# Patient Record
Sex: Female | Born: 1960 | Race: White | Hispanic: No | Marital: Single | Smoking: Former smoker
Health system: Southern US, Community
[De-identification: ages and names within clinical notes are randomized; demographics above are authoritative.]

## PROBLEM LIST (undated history)

## (undated) DIAGNOSIS — N2889 Other specified disorders of kidney and ureter: Secondary | ICD-10-CM

## (undated) DIAGNOSIS — T8859XA Other complications of anesthesia, initial encounter: Secondary | ICD-10-CM

## (undated) DIAGNOSIS — R112 Nausea with vomiting, unspecified: Secondary | ICD-10-CM

## (undated) DIAGNOSIS — N189 Chronic kidney disease, unspecified: Secondary | ICD-10-CM

## (undated) DIAGNOSIS — C801 Malignant (primary) neoplasm, unspecified: Secondary | ICD-10-CM

## (undated) DIAGNOSIS — E119 Type 2 diabetes mellitus without complications: Secondary | ICD-10-CM

## (undated) DIAGNOSIS — J189 Pneumonia, unspecified organism: Secondary | ICD-10-CM

## (undated) DIAGNOSIS — Z87442 Personal history of urinary calculi: Secondary | ICD-10-CM

## (undated) DIAGNOSIS — K219 Gastro-esophageal reflux disease without esophagitis: Secondary | ICD-10-CM

## (undated) DIAGNOSIS — Z9889 Other specified postprocedural states: Secondary | ICD-10-CM

## (undated) DIAGNOSIS — Z87820 Personal history of traumatic brain injury: Secondary | ICD-10-CM

## (undated) DIAGNOSIS — T4145XA Adverse effect of unspecified anesthetic, initial encounter: Secondary | ICD-10-CM

## (undated) DIAGNOSIS — I10 Essential (primary) hypertension: Secondary | ICD-10-CM

## (undated) DIAGNOSIS — J40 Bronchitis, not specified as acute or chronic: Secondary | ICD-10-CM

## (undated) DIAGNOSIS — Z8489 Family history of other specified conditions: Secondary | ICD-10-CM

## (undated) DIAGNOSIS — L409 Psoriasis, unspecified: Secondary | ICD-10-CM

---

## 2006-12-18 ENCOUNTER — Ambulatory Visit: Payer: Self-pay | Admitting: Oncology

## 2007-02-09 ENCOUNTER — Encounter: Payer: Self-pay | Admitting: Hematology and Oncology

## 2007-02-09 ENCOUNTER — Ambulatory Visit: Payer: Self-pay | Admitting: Hematology and Oncology

## 2007-02-09 ENCOUNTER — Other Ambulatory Visit: Admission: RE | Admit: 2007-02-09 | Discharge: 2007-02-09 | Payer: Self-pay | Admitting: Hematology and Oncology

## 2007-02-09 LAB — CBC WITH DIFFERENTIAL/PLATELET
BASO%: 0.7 % (ref 0.0–2.0)
HCT: 42.6 % (ref 34.8–46.6)
LYMPH%: 26.1 % (ref 14.0–48.0)
MCH: 32.4 pg (ref 26.0–34.0)
MCHC: 35.9 g/dL (ref 32.0–36.0)
MCV: 90.2 fL (ref 81.0–101.0)
MONO#: 0.6 10*3/uL (ref 0.1–0.9)
MONO%: 4.6 % (ref 0.0–13.0)
NEUT%: 66.2 % (ref 39.6–76.8)
Platelets: 331 10*3/uL (ref 145–400)

## 2007-02-09 LAB — ERYTHROCYTE SEDIMENTATION RATE: Sed Rate: 10 mm/hr (ref 0–30)

## 2007-02-12 LAB — COMPREHENSIVE METABOLIC PANEL
ALT: 10 U/L (ref 0–35)
AST: 15 U/L (ref 0–37)
Albumin: 4.1 g/dL (ref 3.5–5.2)
BUN: 9 mg/dL (ref 6–23)
Calcium: 9 mg/dL (ref 8.4–10.5)
Chloride: 103 mEq/L (ref 96–112)
Potassium: 4.4 mEq/L (ref 3.5–5.3)

## 2007-02-15 ENCOUNTER — Ambulatory Visit (HOSPITAL_COMMUNITY): Admission: RE | Admit: 2007-02-15 | Discharge: 2007-02-15 | Payer: Self-pay | Admitting: Hematology and Oncology

## 2008-03-17 IMAGING — CT CT CHEST W/ CM
2 of 4 series · 15 of 36 positions shown, 18 images · IV contrast (omnipaque)
Comparison: None

CLINICAL DATA: Smoker and cough

CHEST CT WITH CONTRAST
TECHNIQUE: Multidetector CT imaging of the chest was performed following the
standard protocol during bolus administration of intravenous contrast.
Contrast:  50 cc Omnipaque 300

[Series 2: chest routine 5.0 b40f · axial · 0.74mm/px · z∈[-357,-67]mm · 12 of 68 slices shown, 15 images]
[im 5/68  mediastinal]
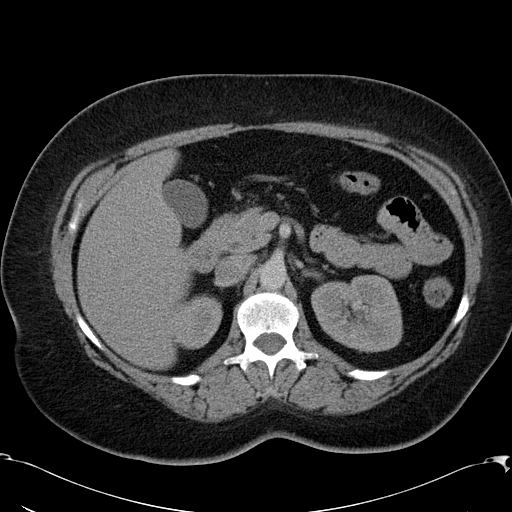
[im 5/68  lung]
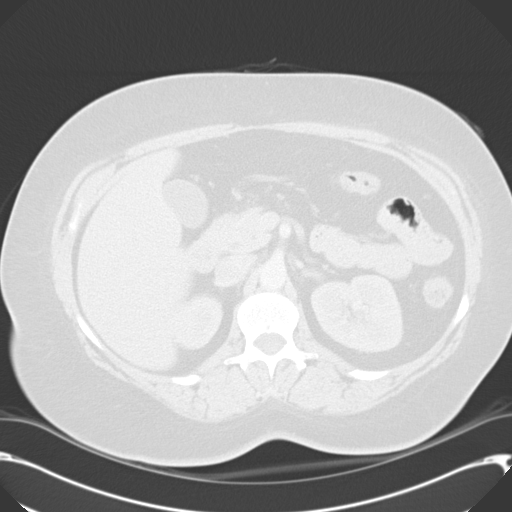
[im 10/68  lung]
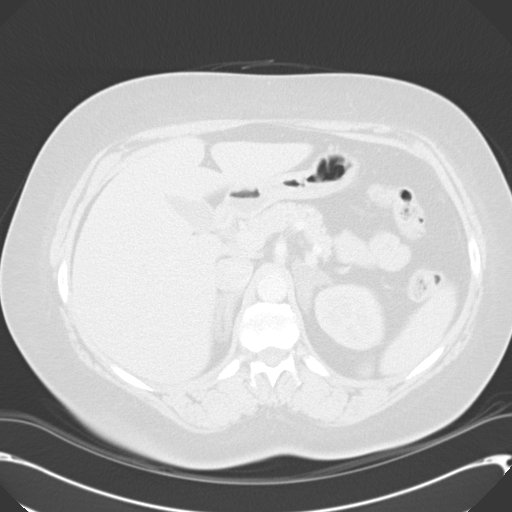
[im 15/68  lung]
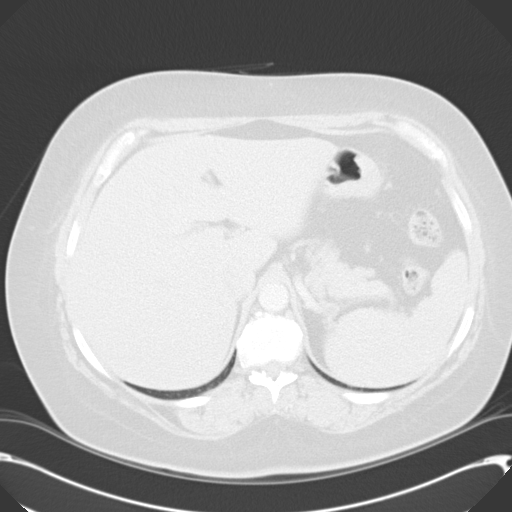
[im 20/68  lung]
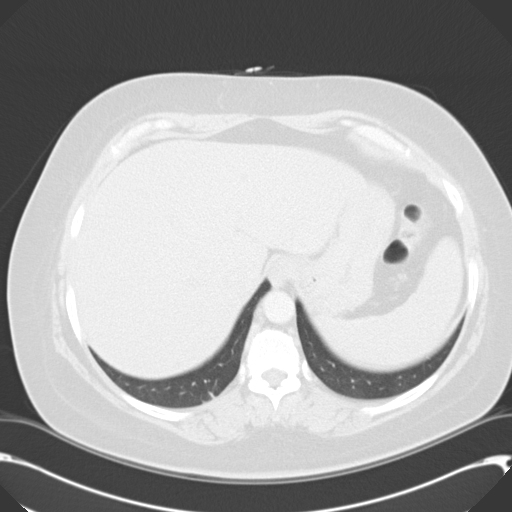
[im 24/68  mediastinal]
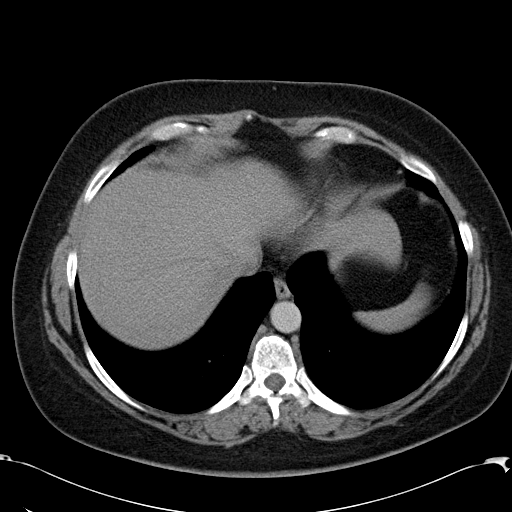
[im 24/68  lung]
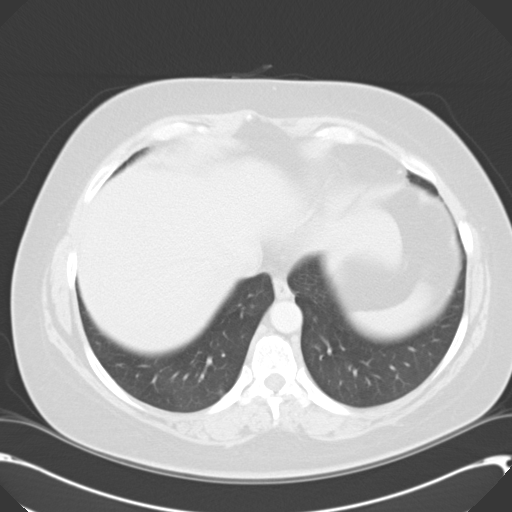
[im 29/68  lung]
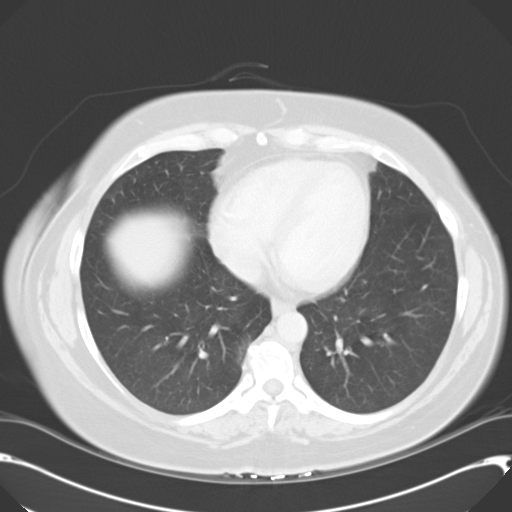
[im 39/68  lung]
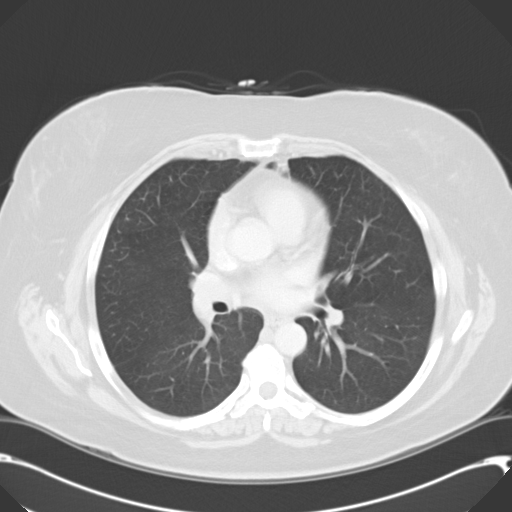
[im 44/68  lung]
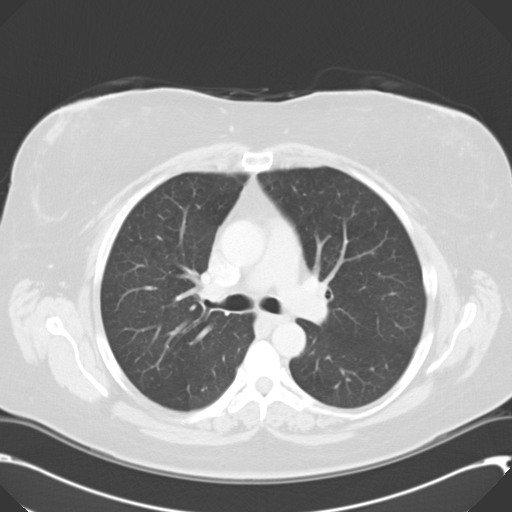
[im 48/68  mediastinal]
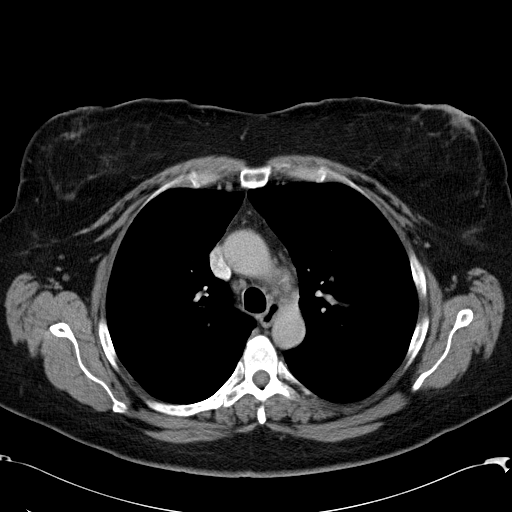
[im 48/68  lung]
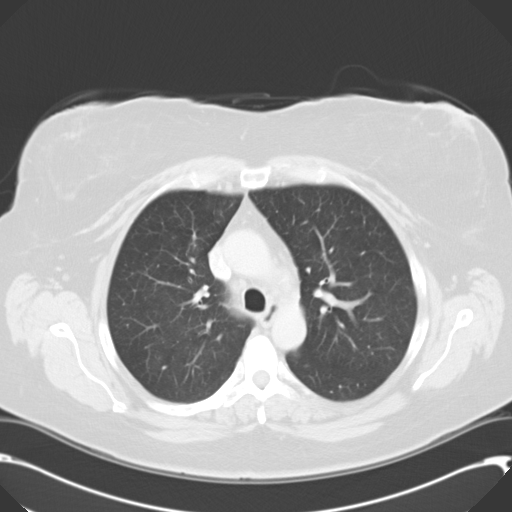
[im 53/68  lung]
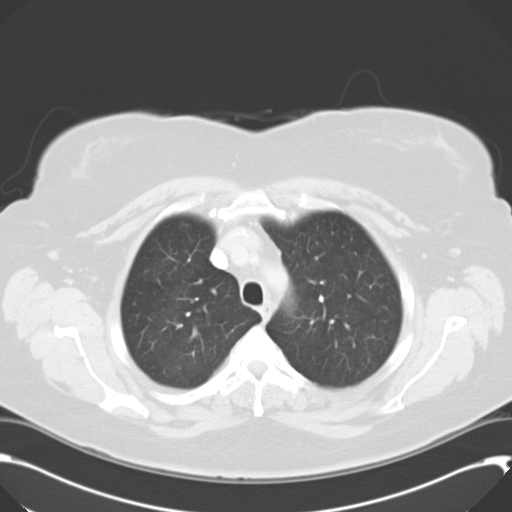
[im 58/68  lung]
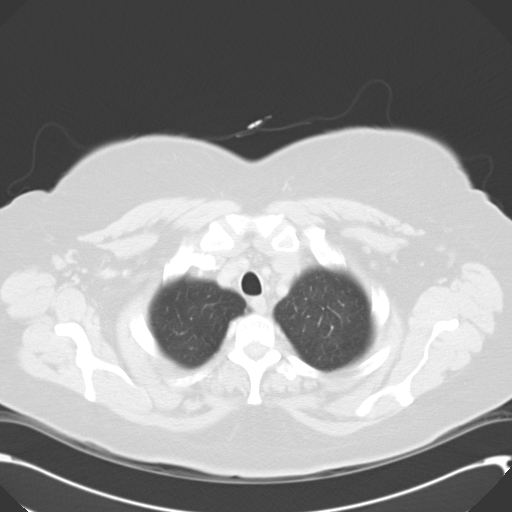
[im 63/68  lung]
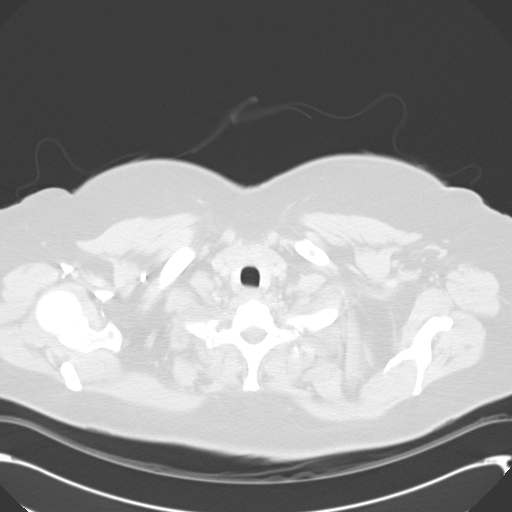

[Series 602: <mpr thick range> · coronal · 0.74mm/px · 3 of 84 slices shown]
[im 17/84  lung]
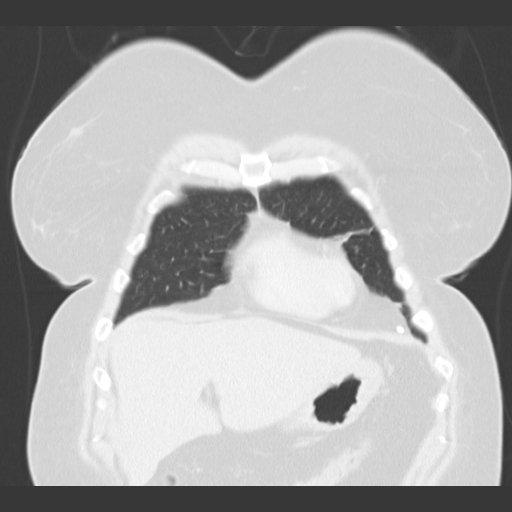
[im 34/84  lung]
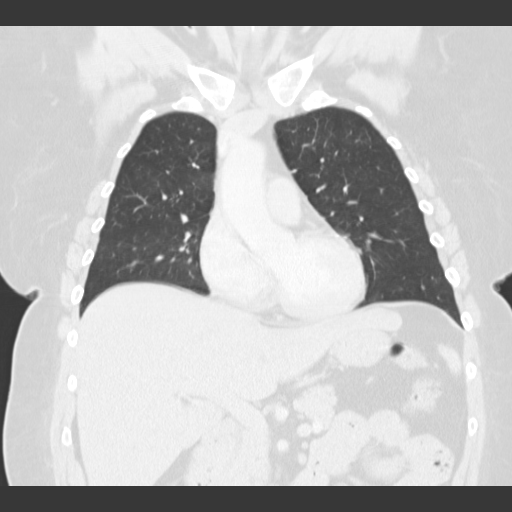
[im 50/84  lung]
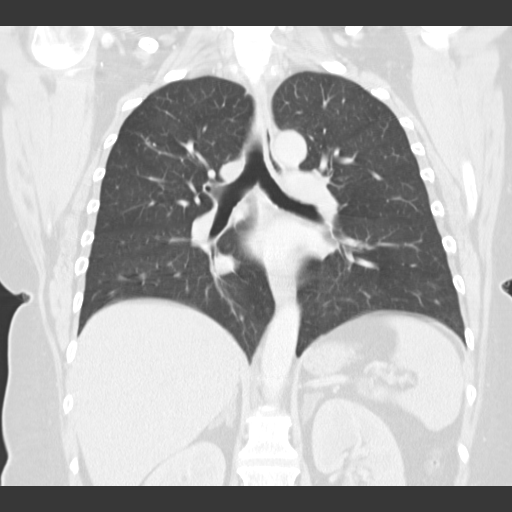

[15 of 36 positions shown; findings below may reference images not displayed]

FINDINGS: No enlarged axillary lymph nodes.

No enlarged supraclavicular lymph nodes.

There is no enlarged mediastinal, or hilar lymph nodes.

No pericardial or pleural effusion noted.

Left lower lobe pulmonary nodule measures 2.3 mm, image 36.

Clustered tiny nodules within the right upper lobe are noted, the largest of
which measures 3 mm, image 18.

There is scarring identified within the lingular segment of the left lung.

Review of the visualized osseous structures shows thoracic spondylosis.

No lytic or sclerotic lesions are identified.

There is enlargement of both adrenal glands without focal mass.

IMPRESSION

1. No specific evidence for malignancy or acute infectious process.

2. Clustered micronodules in the right upper lobe and a solitary micronodular in
the left lower lobe are noted. These have a nonspecific appearance. A followup
examination in 6 months is recommended.

## 2014-06-27 HISTORY — PX: KIDNEY SURGERY: SHX687

## 2015-10-29 ENCOUNTER — Encounter (HOSPITAL_BASED_OUTPATIENT_CLINIC_OR_DEPARTMENT_OTHER): Payer: Self-pay | Admitting: *Deleted

## 2015-10-29 ENCOUNTER — Other Ambulatory Visit: Payer: Self-pay | Admitting: Urology

## 2015-10-29 NOTE — Progress Notes (Signed)
NPO AFTER MN.  ARRIVE AT 0800.  NEEDS ISTAT AND EKG. 

## 2015-10-30 ENCOUNTER — Ambulatory Visit (HOSPITAL_BASED_OUTPATIENT_CLINIC_OR_DEPARTMENT_OTHER): Payer: Commercial Managed Care - PPO | Admitting: Certified Registered"

## 2015-10-30 ENCOUNTER — Encounter (HOSPITAL_BASED_OUTPATIENT_CLINIC_OR_DEPARTMENT_OTHER): Payer: Self-pay | Admitting: Certified Registered"

## 2015-10-30 ENCOUNTER — Ambulatory Visit (HOSPITAL_BASED_OUTPATIENT_CLINIC_OR_DEPARTMENT_OTHER)
Admission: RE | Admit: 2015-10-30 | Discharge: 2015-10-30 | Disposition: A | Discharge status: Home / Self Care | Payer: Commercial Managed Care - PPO | Source: Ambulatory Visit | Attending: Urology | Admitting: Urology

## 2015-10-30 ENCOUNTER — Encounter (HOSPITAL_BASED_OUTPATIENT_CLINIC_OR_DEPARTMENT_OTHER)
Admission: RE | Disposition: A | Discharge status: Home / Self Care | Payer: Self-pay | Source: Ambulatory Visit | Attending: Urology

## 2015-10-30 DIAGNOSIS — N1330 Unspecified hydronephrosis: Secondary | ICD-10-CM | POA: Diagnosis not present

## 2015-10-30 DIAGNOSIS — C662 Malignant neoplasm of left ureter: Secondary | ICD-10-CM | POA: Insufficient documentation

## 2015-10-30 DIAGNOSIS — Z79899 Other long term (current) drug therapy: Secondary | ICD-10-CM | POA: Insufficient documentation

## 2015-10-30 DIAGNOSIS — Z7984 Long term (current) use of oral hypoglycemic drugs: Secondary | ICD-10-CM | POA: Diagnosis not present

## 2015-10-30 DIAGNOSIS — N289 Disorder of kidney and ureter, unspecified: Secondary | ICD-10-CM | POA: Diagnosis present

## 2015-10-30 DIAGNOSIS — E119 Type 2 diabetes mellitus without complications: Secondary | ICD-10-CM | POA: Diagnosis not present

## 2015-10-30 DIAGNOSIS — F17210 Nicotine dependence, cigarettes, uncomplicated: Secondary | ICD-10-CM | POA: Diagnosis not present

## 2015-10-30 DIAGNOSIS — N2889 Other specified disorders of kidney and ureter: Secondary | ICD-10-CM

## 2015-10-30 HISTORY — PX: CYSTOSCOPY W/ URETERAL STENT PLACEMENT: SHX1429

## 2015-10-30 HISTORY — DX: Personal history of traumatic brain injury: Z87.820

## 2015-10-30 HISTORY — DX: Psoriasis, unspecified: L40.9

## 2015-10-30 HISTORY — DX: Gastro-esophageal reflux disease without esophagitis: K21.9

## 2015-10-30 HISTORY — DX: Type 2 diabetes mellitus without complications: E11.9

## 2015-10-30 HISTORY — DX: Other specified disorders of kidney and ureter: N28.89

## 2015-10-30 LAB — POCT HEMOGLOBIN-HEMACUE: Hemoglobin: 13 g/dL (ref 12.0–15.0)

## 2015-10-30 SURGERY — CYSTOSCOPY, WITH RETROGRADE PYELOGRAM AND URETERAL STENT INSERTION
Anesthesia: General | Laterality: Left

## 2015-10-30 MED ORDER — FENTANYL CITRATE (PF) 100 MCG/2ML IJ SOLN
INTRAMUSCULAR | Status: AC
  Filled 2015-10-30: qty 2

## 2015-10-30 MED ORDER — HYDROCODONE-ACETAMINOPHEN 10-325 MG PO TABS: 1.0000 | ORAL_TABLET | ORAL | Status: DC | PRN

## 2015-10-30 MED ORDER — LIDOCAINE HCL (CARDIAC) 20 MG/ML IV SOLN
INTRAVENOUS | Status: DC | PRN
  Administered 2015-10-30: 60 mg via INTRAVENOUS

## 2015-10-30 MED ORDER — MIDAZOLAM HCL 5 MG/5ML IJ SOLN
INTRAMUSCULAR | Status: DC | PRN
  Administered 2015-10-30: 2 mg via INTRAVENOUS

## 2015-10-30 MED ORDER — CIPROFLOXACIN IN D5W 400 MG/200ML IV SOLN
400.0000 mg | INTRAVENOUS | Status: AC
  Administered 2015-10-30 (×2): 400 mg via INTRAVENOUS
  Filled 2015-10-30: qty 200

## 2015-10-30 MED ORDER — HYDROMORPHONE HCL 1 MG/ML IJ SOLN
0.2500 mg | INTRAMUSCULAR | Status: DC | PRN
  Filled 2015-10-30: qty 1

## 2015-10-30 MED ORDER — TAMSULOSIN HCL 0.4 MG PO CAPS
ORAL_CAPSULE | ORAL | Status: AC
  Filled 2015-10-30: qty 1

## 2015-10-30 MED ORDER — FENTANYL CITRATE (PF) 100 MCG/2ML IJ SOLN
INTRAMUSCULAR | Status: DC | PRN
  Administered 2015-10-30: 50 ug via INTRAVENOUS
  Administered 2015-10-30 (×3): 25 ug via INTRAVENOUS

## 2015-10-30 MED ORDER — PHENAZOPYRIDINE HCL 200 MG PO TABS: 200.0000 mg | ORAL_TABLET | Freq: Three times a day (TID) | ORAL | Status: DC | PRN

## 2015-10-30 MED ORDER — PROPOFOL 10 MG/ML IV BOLUS
INTRAVENOUS | Status: DC | PRN
  Administered 2015-10-30: 200 mg via INTRAVENOUS

## 2015-10-30 MED ORDER — ONDANSETRON HCL 4 MG/2ML IJ SOLN
INTRAMUSCULAR | Status: AC
  Filled 2015-10-30: qty 2

## 2015-10-30 MED ORDER — MEPERIDINE HCL 25 MG/ML IJ SOLN
6.2500 mg | INTRAMUSCULAR | Status: DC | PRN
  Filled 2015-10-30: qty 1

## 2015-10-30 MED ORDER — PROMETHAZINE HCL 25 MG/ML IJ SOLN
6.2500 mg | INTRAMUSCULAR | Status: DC | PRN
  Filled 2015-10-30: qty 1

## 2015-10-30 MED ORDER — LIDOCAINE HCL (CARDIAC) 20 MG/ML IV SOLN
INTRAVENOUS | Status: AC
  Filled 2015-10-30: qty 5

## 2015-10-30 MED ORDER — OXYBUTYNIN CHLORIDE 5 MG PO TABS
ORAL_TABLET | ORAL | Status: AC
  Filled 2015-10-30: qty 1

## 2015-10-30 MED ORDER — PROPOFOL 10 MG/ML IV BOLUS
INTRAVENOUS | Status: AC
  Filled 2015-10-30: qty 20

## 2015-10-30 MED ORDER — LACTATED RINGERS IV SOLN
INTRAVENOUS | Status: DC
  Filled 2015-10-30: qty 1000

## 2015-10-30 MED ORDER — DEXAMETHASONE SODIUM PHOSPHATE 10 MG/ML IJ SOLN
INTRAMUSCULAR | Status: AC
  Filled 2015-10-30: qty 1

## 2015-10-30 MED ORDER — CIPROFLOXACIN IN D5W 400 MG/200ML IV SOLN
INTRAVENOUS | Status: AC
  Filled 2015-10-30: qty 200

## 2015-10-30 MED ORDER — PHENAZOPYRIDINE HCL 200 MG PO TABS
200.0000 mg | ORAL_TABLET | Freq: Once | ORAL | Status: AC
  Administered 2015-10-30: 200 mg via ORAL
  Filled 2015-10-30: qty 1

## 2015-10-30 MED ORDER — LACTATED RINGERS IV SOLN
INTRAVENOUS | Status: DC
  Administered 2015-10-30: 09:00:00 via INTRAVENOUS
  Filled 2015-10-30: qty 1000

## 2015-10-30 MED ORDER — TAMSULOSIN HCL 0.4 MG PO CAPS
0.4000 mg | ORAL_CAPSULE | Freq: Once | ORAL | Status: AC
  Administered 2015-10-30: 0.4 mg via ORAL
  Filled 2015-10-30: qty 1

## 2015-10-30 MED ORDER — MIDAZOLAM HCL 2 MG/2ML IJ SOLN
INTRAMUSCULAR | Status: AC
  Filled 2015-10-30: qty 2

## 2015-10-30 MED ORDER — OXYBUTYNIN CHLORIDE 5 MG PO TABS
5.0000 mg | ORAL_TABLET | Freq: Once | ORAL | Status: AC
  Administered 2015-10-30: 5 mg via ORAL
  Filled 2015-10-30: qty 1

## 2015-10-30 MED ORDER — WHITE PETROLATUM GEL
Status: AC
  Filled 2015-10-30: qty 5

## 2015-10-30 MED ORDER — ONDANSETRON HCL 4 MG/2ML IJ SOLN
INTRAMUSCULAR | Status: DC | PRN
  Administered 2015-10-30: 4 mg via INTRAVENOUS

## 2015-10-30 MED ORDER — PHENAZOPYRIDINE HCL 100 MG PO TABS
ORAL_TABLET | ORAL | Status: AC
  Filled 2015-10-30: qty 2

## 2015-10-30 MED ORDER — DEXAMETHASONE SODIUM PHOSPHATE 4 MG/ML IJ SOLN
INTRAMUSCULAR | Status: DC | PRN
  Administered 2015-10-30: 10 mg via INTRAVENOUS

## 2015-10-30 SURGICAL SUPPLY — 31 items
ADAPTER CATH URET PLST 4-6FR (CATHETERS) IMPLANT
BAG DRAIN URO-CYSTO SKYTR STRL (DRAIN) ×2 IMPLANT
BASKET STNLS GEMINI 4WIRE 3FR (BASKET) ×2 IMPLANT
BASKET ZERO TIP NITINOL 2.4FR (BASKET) ×2 IMPLANT
CANISTER SUCT LVC 12 LTR MEDI- (MISCELLANEOUS) IMPLANT
CATH INTERMIT  6FR 70CM (CATHETERS) ×4 IMPLANT
CATH URET 5FR 28IN CONE TIP (BALLOONS)
CATH URET 5FR 70CM CONE TIP (BALLOONS) IMPLANT
CATH URET DUAL LUMEN 6-10FR 50 (CATHETERS) ×2 IMPLANT
CLOTH BEACON ORANGE TIMEOUT ST (SAFETY) ×2 IMPLANT
DRSG TELFA 3X8 NADH (GAUZE/BANDAGES/DRESSINGS) ×2 IMPLANT
FORCEPS BIOP 2.4F 115CM BACKLD (INSTRUMENTS) ×2 IMPLANT
GLOVE BIO SURGEON STRL SZ8 (GLOVE) ×2 IMPLANT
GOWN STRL REUS W/ TWL LRG LVL3 (GOWN DISPOSABLE) ×1 IMPLANT
GOWN STRL REUS W/ TWL XL LVL3 (GOWN DISPOSABLE) ×1 IMPLANT
GOWN STRL REUS W/TWL LRG LVL3 (GOWN DISPOSABLE) ×1
GOWN STRL REUS W/TWL XL LVL3 (GOWN DISPOSABLE) ×1
GUIDEWIRE 0.038 PTFE COATED (WIRE) ×2 IMPLANT
GUIDEWIRE ANG ZIPWIRE 038X150 (WIRE) IMPLANT
GUIDEWIRE STR DUAL SENSOR (WIRE) ×6 IMPLANT
IV NS IRRIG 3000ML ARTHROMATIC (IV SOLUTION) ×2 IMPLANT
KIT BALLN UROMAX 15FX4 (MISCELLANEOUS) ×1 IMPLANT
KIT BALLN UROMAX 26 75X4 (MISCELLANEOUS) ×1
KIT ROOM TURNOVER WOR (KITS) ×2 IMPLANT
MANIFOLD NEPTUNE II (INSTRUMENTS) IMPLANT
NS IRRIG 500ML POUR BTL (IV SOLUTION) ×2 IMPLANT
PACK CYSTO (CUSTOM PROCEDURE TRAY) ×2 IMPLANT
STENT URET 6FRX24 CONTOUR (STENTS) ×2 IMPLANT
TUBE CONNECTING 12X1/4 (SUCTIONS) IMPLANT
UROMAX ULTRA BALLOON ×2 IMPLANT
WATER STERILE IRR 3000ML UROMA (IV SOLUTION) IMPLANT

## 2015-10-30 NOTE — H&P (Signed)
History of Present Illness Joan Ramirez is a 55 year old female with a left proximal ureteral mass.    Gross hematuria: She noticed gross hematuria that was not associated with any flank pain or change in her urinary pattern. She has a 37-pack-year smoking history. Her urine culture was found to be negative. She told me that she actually had experienced hematuria on 2 occasions about once a year over the past 2 years.  Urine cytology: Negative  Creatinine - 0.71     Interval history: She has not noticed any further hematuria. She denies flank pain.   Past Medical History Problems  1. History of asthma (Z87.09) 2. History of diabetes mellitus (Z86.39)  Surgical History Problems  1. History of No Surgical Problems  Current Meds 1. Fish Oil CAPS;  Therapy: (Recorded:18Apr2017) to Recorded 2. GlyBURIDE-MetFORMIN 2.5-500 MG Oral Tablet;  Therapy: (Recorded:04May2017) to Recorded  Allergies Medication  1. No Known Drug Allergies  Family History Problems  1. Family history of Death of family member : Mother, Father   Mother - 62/ lung cancerFather - 76/ lung cancer 2. Family history of diabetes mellitus (Z83.3) : Father, Brother 3. Family history of lung cancer (Z80.1) : Mother, Father 4. Family history of lymphoma (Z80.2) : Mother 5. Family history of skin cancer (Z80.8) : Mother  Social History Problems  1. Denied: History of Alcohol use 2. Caffeine use (F15.90)   5-6 per day 3. Current every day smoker (F17.200)   smokes 2 packs per day since 1977 4. Occupation   Dance movement psychotherapist 5. Single   Review of Systems Genitourinary, constitutional, skin, eye, otolaryngeal, hematologic/lymphatic, cardiovascular, pulmonary, endocrine, musculoskeletal, gastrointestinal, neurological and psychiatric system(s) were reviewed and pertinent findings if present are noted and are otherwise negative.  Genitourinary: nocturia, incontinence, urinary hesitancy and hematuria.   Gastrointestinal: vomiting, heartburn, diarrhea and constipation.  Constitutional: feeling tired (fatigue) and recent weight loss.  Integumentary: skin rash/lesion and pruritus.  Respiratory: cough.  Endocrine: polydipsia.  Neurological: dizziness.    Physical Exam Constitutional: Well nourished and well developed . No acute distress. The patient appears well hydrated.  ENT:. The ears and nose are normal in appearance.  Neck: The appearance of the neck is normal.  Pulmonary: No respiratory distress.  Cardiovascular: Heart rate and rhythm are normal.  Abdomen: The abdomen is mildly obese. The abdomen is soft and nontender. No suprapubic tenderness. No CVA tenderness.  Genitourinary:Examination of the external genitalia shows normal female external genitalia. The urethra is normal in appearance. Vaginal exam demonstrates atrophy and the vaginal epithelium to be poorly estrogenized. No cystocele is identified. No rectocele is identified. The bladder is normal on palpation and non tender. The anus is normal on inspection. The perineum is normal on inspection.  Skin: Normal skin turgor and normal skin color and pigmentation.  Neuro/Psych:. Mood and affect are appropriate.     Vitals Vital Signs  Height: 5 ft 7 in Weight: 204 lb  BMI Calculated: 31.95 BSA Calculated: 2.04 Blood Pressure: 156 / 85 Heart Rate: 91  Physical Exam Genitourinary:  Chaperone Present: Joan Ramirez.  Examination of the external genitalia shows normal female external genitalia and no lesions. The urethra is normal in appearance and not tender. There is no urethral mass. Vaginal exam demonstrates atrophy. The adnexa are palpably normal. The bladder is non tender and not distended. The anus is normal on inspection. The perineum is normal on inspection.    Results/Data Urine  COLOR YELLOW  APPEARANCE CLEAR  SPECIFIC GRAVITY 1.020  pH  5.5  GLUCOSE NEGATIVE  BILIRUBIN NEGATIVE  KETONE NEGATIVE  BLOOD 3+   PROTEIN NEGATIVE  NITRITE NEGATIVE  LEUKOCYTE ESTERASE NEGATIVE  SQUAMOUS EPITHELIAL/HPF 0-5 HPF WBC 0-5 WBC/HPF RBC 3-10 RBC/HPF BACTERIA NONE SEEN HPF CRYSTALS See Below HPF CASTS NONE SEEN LPF Yeast NONE SEEN HPF     ** RADIOLOGY REPORT BY [facility] **   CLINICAL DATA: Micro hematuria and gross hematuria.  EXAM: CT ABDOMEN AND PELVIS WITHOUT AND WITH CONTRAST  TECHNIQUE: Multidetector CT imaging of the abdomen and pelvis was performed following the standard protocol before and following the bolus administration of intravenous contrast.  CONTRAST: 125 cc Isovue  COMPARISON: CT thorax 02/15/2008  FINDINGS: Lower chest: Lung bases are clear.  Hepatobiliary: No focal hepatic lesion. No biliary duct dilatation. Gallbladder is normal. Common bile duct is normal.  Pancreas: Pancreas is normal. No ductal dilatation. No pancreatic inflammation.  Spleen: Normal spleen  Adrenals/urinary tract: Bilateral low-density enlargement of the adrenal glands consistent with bilateral adrenal adenomas.  Non IV contrast images demonstrate a 1 mm calculus in the lower pole of the LEFT kidney and a 1 mm calculus lower pole of the RIGHT kidney. Note ureterolithiasis or bladder calculi.  There is an enhancing mass within the LEFT renal pelvis which extends into the proximal LEFT ureter. This mass expands the LEFT renal pelvis to 3 cm (image 42, series 4). Mass extends to to the ureteropelvic junction (image 64 of the coronal series). There is hydronephrosis of the LEFT kidney associated with the renal pelvis mass.  Delayed imaging demonstrates filling of the distal LEFT ureter consistent partial obstruction of the LEFT kidney.  No filling defect within the bladder. The RIGHT kidney and ureter are normal.  Stomach/Bowel: Stomach, small bowel, appendix, and cecum are normal. The colon and rectosigmoid colon are normal.  Vascular/Lymphatic: Abdominal aorta is  normal caliber. There is no retroperitoneal or periportal lymphadenopathy. No pelvic lymphadenopathy.  Reproductive: Post hysterectomy.  Other: No free fluid.  Musculoskeletal: No aggressive osseous lesion.  IMPRESSION: 1. 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 2. Moderate to severe hydronephrosis of the LEFT kidney secondary to a partially obstructing LEFT renal pelvis mass. 3. Small bilateral renal calculi. 4. No RIGHT ureteral lesion or bladder lesion identified. 5. Bilateral adrenal adenomas. These results will be called to the ordering clinician or representative by the Radiologist Assistant, and communication documented in the PACS or zVision Dashboard.   Electronically Signed  By: Joan Ramirez M.D.  On: 10/22/2015 09:44  BUN & CREATININE 18Apr2017 09:44AM Ramirez, Joan SPECIMEN TYPE: BLOOD  Test Name Result Flag Reference CREATININE 0.71 mg/dL  0.50-1.40 BUN 12 mg/dL  7-25 Est GFR, African American >89 mL/min  >=60 Est GFR, NonAfrican American >89 mL/min  >=60 THE ESTIMATED GFR IS A CALCULATION VALID FOR ADULTS (>=18 YEARS OLD) THAT USES THE CKD-EPI ALGORITHM TO ADJUST FOR AGE AND SEX. IT IS   NOT TO BE USED FOR CHILDREN, PREGNANT WOMEN, HOSPITALIZED PATIENTS,    PATIENTS ON DIALYSIS, OR WITH RAPIDLY CHANGING KIDNEY FUNCTION. ACCORDING TO THE NKDEP, EGFR >89 IS NORMAL, 60-89 SHOWS MILD IMPAIRMENT, 30-59 SHOWS MODERATE IMPAIRMENT, 15-29 SHOWS SEVERE IMPAIRMENT AND <15 IS ESRD.  URINE CULTURE 18Apr2017 09:33AM Ramirez, Joan SOURCE : CLEAN CATCH SPECIMEN TYPE: URINE  Test Name Result Flag Reference CULTURE, URINE Culture, Urine   ===== COLONY COUNT: =====  NO GROWTH   FINAL REPORT: NO GROWTH  URINE CYTOLOGY W/REFLEX FISH 18Apr2017 09:31AM Ramirez, Joan SPECIMEN TYPE: URINE    Test Name Result Flag Reference FINAL DIAGNOSIS:    - NO MALIGNANT CELLS IDENTIFIED. NEGATIVE FOR HIGH-GRADE UROTHELIAL  CARCINOMA. NEUTROPHILS ARE PRESENT. NUMBER OF SLIDES    2 Containers Submitted SOURCE:    Urine: Not Otherwise Specified CUP 1 60CC OF CLEAR YELLOW URINE RECEIVED IN FIXATIVE CUP 2 Banks OF CLEAR YELLOW URINE RECEIVED IN FIXATIVE 1 SLIDE PREPARED 10/14/15 LW Relevant Clinical Info GROSS HEMATURIA   CYTOTECHNOLOGIST:    MJS, BA CT(ASCP) PATHOLOGIST:    REVIEWED BY S. Vidalia, MD, (ELECTRONIC SIGNATURE ON FILE)  Procedure  Procedure: Cystoscopy done on 10/29/15 Chaperone Present: Joan Ramirez.  Indication: Hematuria.  Informed Consent: Risks, benefits, and potential adverse events were discussed and informed consent was obtained from the patient.  Prep: The patient was prepped with hibiclens.  Procedure Note:  Urethral meatus:. No abnormalities.  Anterior urethra: No abnormalities.  Bladder: Visulization was clear. The ureteral orifices were in the normal anatomic position bilaterally and had clear efflux of urine. A systematic survey of the bladder demonstrated no bladder tumors or stones. The mucosa was smooth without abnormalities. Examination of the bladder demonstrated mild trabeculation. The patient tolerated the procedure well.  Complications: None.    Assessment   On cystoscopy today no abnormality was noted.      I went over her CT scan findings with her which has revealed bilateral adrenal adenomas which are benign finding. In addition she has bilateral punctate lower pole renal calculi which are not causing any obstruction and are of no clinical significance at this time.    She has a large filling defect/neoplasm involving the renal pelvis of the left kidney. It has caused dilatation of the intrarenal collecting system that has the appearance of relatively long-standing obstruction based on some loss of renal parenchyma and blunting of the calyces. 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 It may be low grade which is why no abnormal cells were identified on cytology. I told her that further characterization of this lesion is required and therefore I have recommended cystoscopy with left retrograde pyelogram and biopsy of the lesion. I told her I would attempt to place a stent but there is a possibility that this may not be possible. At this time however with a creatinine of 0.71 I told her she had not experienced any significant compromise of her renal function. In addition there did not appear to be any abdominal adenopathy or spread outside of the collecting system at this time. We went over ureteroscopy and biopsy in detail including the risks and complications, the alternatives, the probability of success as well as the outpatient nature and anticipated postoperative course. Her questions were answered to her satisfaction and she has elected to proceed.   Plan  She will be scheduled for cystoscopy with left retrograde pyelogram, ureteroscopy, biopsy and attempted stent placement.

## 2015-10-30 NOTE — Discharge Instructions (Signed)
Post stent placement instructions ° ° °Definitions: ° °Ureter: The duct that transports urine from the kidney to the bladder. °Stent: A plastic hollow tube that is placed into the ureter, from the kidney to the bladder to prevent the ureter from swelling shut. ° °General instructions: ° °Despite the fact that no skin incisions were used, the area around the ureter and bladder is raw and irritated. The stent is a foreign body which can further irritate the bladder wall. This irritation is manifested by increased frequency of urination, both day and night, and by an increase in the urge to urinate. In some, the urge to urinate is present almost always. Sometimes the urge is strong enough that you may not be able to stop your self from urinating. This can often be controlled with medication but does not occur in everyone. A stent can safely be left in place for 3 months or greater. ° °You may see some blood in your urine while the stent is in place and a few days afterward. Do not be alarmed, even if the urine is clear for a while. Get off your feet and drink lots of fluids until clearing occurs. If you start to pass clots or don't improve, call us. ° °Diet: ° °You may return to your normal diet immediately. Because of the raw surface of your bladder, alcohol, spicy foods, foods high in acid and drinks with caffeine may cause irritation or frequency and should be used in moderation. To keep your urine flowing freely and avoid constipation, drink plenty of fluids during the day (8-10 glasses). Tip: Avoid cranberry juice because it is very acidic. ° °Activity: ° °Your physical activity doesn't need to be restricted. However, if you are very active, you may see some blood in the urine. We suggest that you reduce your activity under the circumstances until the bleeding has stopped. ° °Bowels: ° °It is important to keep your bowels regular during the postoperative period. Straining with bowel movements can cause bleeding. A  bowel movement every other day is reasonable. Use a mild laxative if needed, such as milk of magnesia 2-3 tablespoons, or 2 Dulcolax tablets. Call if you continue to have problems. If you had been taking narcotics for pain, before, during or after your surgery, you may be constipated. Take a laxative if necessary. ° °Medication: ° °You should resume your pre-surgery medications unless told not to. In addition you may be given an antibiotic to prevent or treat infection. Antibiotics are not always necessary. All medication should be taken as prescribed until the bottles are finished unless you are having an unusual reaction to one of the drugs. ° °Problems you should report to us: ° °a. Fever greater than 101°F. °b. Heavy bleeding, or clots (see notes above about blood in urine). °c. Inability to urinate. °d. Drug reactions (hives, rash, nausea, vomiting, diarrhea). °e. Severe burning or pain with urination that is not improving. ° ° °Post Anesthesia Home Care Instructions ° °Activity: °Get plenty of rest for the remainder of the day. A responsible adult should stay with you for 24 hours following the procedure.  °For the next 24 hours, DO NOT: °-Drive a car °-Operate machinery °-Drink alcoholic beverages °-Take any medication unless instructed by your physician °-Make any legal decisions or sign important papers. ° °Meals: °Start with liquid foods such as gelatin or soup. Progress to regular foods as tolerated. Avoid greasy, spicy, heavy foods. If nausea and/or vomiting occur, drink only clear liquids until the nausea   and/or vomiting subsides. Call your physician if vomiting continues. ° °Special Instructions/Symptoms: °Your throat may feel dry or sore from the anesthesia or the breathing tube placed in your throat during surgery. If this causes discomfort, gargle with warm salt water. The discomfort should disappear within 24 hours. ° °If you had a scopolamine patch placed behind your ear for the management of  post- operative nausea and/or vomiting: ° °1. The medication in the patch is effective for 72 hours, after which it should be removed.  Wrap patch in a tissue and discard in the trash. Wash hands thoroughly with soap and water. °2. You may remove the patch earlier than 72 hours if you experience unpleasant side effects which may include dry mouth, dizziness or visual disturbances. °3. Avoid touching the patch. Wash your hands with soap and water after contact with the patch. °  ° ° ° ° °

## 2015-10-30 NOTE — Transfer of Care (Signed)
Immediate Anesthesia Transfer of Care Note  Patient: Joan Ramirez  Procedure(s) Performed: Procedure(s) (LRB): CYSTOSCOPY WITH LEFT  RETROGRADE, PYELOGGRAM URETEROSCOPY, BIOPSY AND STENT PLACEMENT (Left)  Patient Location: PACU  Anesthesia Type: General  Level of Consciousness: awake, oriented, sedated and patient cooperative  Airway & Oxygen Therapy: Patient Spontanous Breathing and Patient connected to face mask oxygen  Post-op Assessment: Report given to PACU RN and Post -op Vital signs reviewed and stable  Post vital signs: Reviewed and stable  Complications: No apparent anesthesia complications

## 2015-10-30 NOTE — Anesthesia Procedure Notes (Signed)
Procedure Name: LMA Insertion Date/Time: 10/30/2015 8:57 AM Performed by: Denna Haggard D Pre-anesthesia Checklist: Patient identified, Emergency Drugs available, Suction available and Patient being monitored Patient Re-evaluated:Patient Re-evaluated prior to inductionOxygen Delivery Method: Circle System Utilized Preoxygenation: Pre-oxygenation with 100% oxygen Intubation Type: IV induction Ventilation: Mask ventilation without difficulty LMA: LMA inserted LMA Size: 4.0 Number of attempts: 1 Airway Equipment and Method: Bite block Placement Confirmation: positive ETCO2 Tube secured with: Tape Dental Injury: Teeth and Oropharynx as per pre-operative assessment

## 2015-10-30 NOTE — Op Note (Signed)
PATIENT:  Joan Ramirez  PRE-OPERATIVE DIAGNOSIS: Left proximal ureteral mass with hematuria  POST-OPERATIVE DIAGNOSIS: Same  PROCEDURE:  1. Cystoscopy with left retrograde pyelogram including interpretation 2. Left ureteroscopy and ureteral mass biopsy 3. Left double-J stent placement  SURGEON: Claybon Jabs, MD  INDICATION: Ms. Vitelli is a 55 year old female who has a 37-pack-year smoking history and experienced gross hematuria over a two-year period but did not seek evaluation until recently when she experienced further gross hematuria. She was not having any flank pain. A CT scan revealed a large mass in the proximal ureter extending up to the ureteropelvic junction and causing hydronephrosis with some blunting of the calyces indicating fairly long duration of partial obstruction. She has a normal creatinine. She is brought to the operating room today for ureteroscopic evaluation and biopsy.  ANESTHESIA:  General  EBL:  Minimal  DRAINS: 6 French, 24 cm double-J stent in the left ureter (no string)  SPECIMEN:  Biopsy of left ureteral mass  DESCRIPTION OF PROCEDURE: The patient was taken to the major OR and placed on the table. General anesthesia was administered and then the patient was moved to the dorsal lithotomy position. The genitalia was sterilely prepped and draped. An official timeout was performed.  Initially the 1 French cystoscope with 30 lens was passed under direct vision into the bladder. The bladder was then fully inspected. It was noted be free of any tumors, stones or inflammatory lesions. Ureteral orifices were of normal configuration and position. A 6 French open-ended ureteral catheter was then passed through the cystoscope into the ureteral orifice in order to perform a left retrograde pyelogram.  A retrograde pyelogram was performed by injecting full-strength contrast up the left ureter under direct fluoroscopic control. It revealed a filling defect in the  proximal ureter that was rather extensive. Proximal to this there was dilatation of the collecting system. I then passed a 0.038 inch floppy-tipped guidewire through the open ended catheter and into the area of the renal pelvis and this was left in place. The inner portion of a ureteral access sheath was then passed over the guidewire to gently dilate the intramural ureter.  I then passed a second guidewire into the area the renal pelvis and initially passed the flexible ureteroscope over the guidewire but met with resistance just the low where the lesion was located. I therefore injected more contrast and could see some slight tortuosity of the ureter but no definite stricture although this area would not allow easy passage of the scope. I therefore left the guidewires in place and removed the ureteroscope and passed the dilating balloon over the guidewire and dilated this area. I then chose to use the rigid ureteroscope and passed this under direct vision up the ureter and was easily able to pass it into the area of the mass and visually inspected this. 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 I then used first the nitinol basket but was unable to grasp and hold a significant portion of the tumor for biopsy. I switched to the Bigopsy and passed this up and was able to get several biopsies with this. I then used a spiral tip basket and obtained more biopsy specimens from the lesion. The ureteroscope was then removed and I removed one of the guidewires leaving the guidewire that I was able to get into the upper pole calyx in place.   I then backloaded the cystoscope over the guidewire and passed the stent over the guidewire into the area  of the renal pelvis. As the guidewire was removed good curl was noted in the renal pelvis. The bladder was drained and the cystoscope was then removed. The patient tolerated the procedure well no intraoperative complications.  PLAN OF CARE: Discharge to home  after PACU  PATIENT DISPOSITION:  PACU - hemodynamically stable.

## 2015-10-30 NOTE — Anesthesia Preprocedure Evaluation (Addendum)
Anesthesia Evaluation  Patient identified by MRN, date of birth, ID band Patient awake    Reviewed: Allergy & Precautions, NPO status , Patient's Chart, lab work & pertinent test results  Airway Mallampati: I  TM Distance: >3 FB Neck ROM: Full    Dental  (+) Teeth Intact, Dental Advisory Given   Pulmonary Current Smoker,    breath sounds clear to auscultation       Cardiovascular negative cardio ROS   Rhythm:Regular Rate:Normal     Neuro/Psych negative neurological ROS  negative psych ROS   GI/Hepatic Neg liver ROS, GERD  ,  Endo/Other  diabetes, Type 2, Oral Hypoglycemic Agents  Renal/GU negative Renal ROS  negative genitourinary   Musculoskeletal negative musculoskeletal ROS (+)   Abdominal   Peds negative pediatric ROS (+)  Hematology negative hematology ROS (+)   Anesthesia Other Findings   Reproductive/Obstetrics negative OB ROS                            Lab Results  Component Value Date   WBC 13.8* 02/09/2007   HGB 15.3 02/09/2007   HCT 42.6 02/09/2007   MCV 90.2 02/09/2007   PLT 331 02/09/2007   No results found for: INR, PROTIME   Anesthesia Physical Anesthesia Plan  ASA: II  Anesthesia Plan: General   Post-op Pain Management:    Induction: Intravenous  Airway Management Planned: LMA  Additional Equipment:   Intra-op Plan:   Post-operative Plan: Extubation in OR  Informed Consent: I have reviewed the patients History and Physical, chart, labs and discussed the procedure including the risks, benefits and alternatives for the proposed anesthesia with the patient or authorized representative who has indicated his/her understanding and acceptance.   Dental advisory given  Plan Discussed with: CRNA  Anesthesia Plan Comments:         Anesthesia Quick Evaluation

## 2015-10-30 NOTE — Anesthesia Postprocedure Evaluation (Signed)
Anesthesia Post Note  Patient: Joan Ramirez  Procedure(s) Performed: Procedure(s) (LRB): CYSTOSCOPY WITH LEFT  RETROGRADE, PYELOGGRAM URETEROSCOPY, BIOPSY AND STENT PLACEMENT (Left)  Patient location during evaluation: PACU Anesthesia Type: General Level of consciousness: awake and alert Pain management: pain level controlled Vital Signs Assessment: post-procedure vital signs reviewed and stable Respiratory status: spontaneous breathing, nonlabored ventilation, respiratory function stable and patient connected to nasal cannula oxygen Cardiovascular status: blood pressure returned to baseline and stable Postop Assessment: no signs of nausea or vomiting Anesthetic complications: no    Last Vitals:  Filed Vitals:   10/30/15 1023 10/30/15 1030  BP:  150/79  Pulse: 78 72  Temp:    Resp: 14 18    Last Pain: There were no vitals filed for this visit.               Effie Berkshire

## 2015-11-02 LAB — POCT I-STAT 4, (NA,K, GLUC, HGB,HCT)
Glucose, Bld: 175 mg/dL — ABNORMAL HIGH (ref 65–99)
HCT: 47 % — ABNORMAL HIGH (ref 36.0–46.0)
Hemoglobin: 16 g/dL — ABNORMAL HIGH (ref 12.0–15.0)
Potassium: 4 mmol/L (ref 3.5–5.1)
Sodium: 141 mmol/L (ref 135–145)

## 2015-11-03 ENCOUNTER — Encounter (HOSPITAL_BASED_OUTPATIENT_CLINIC_OR_DEPARTMENT_OTHER): Payer: Self-pay | Admitting: Urology

## 2015-11-30 ENCOUNTER — Other Ambulatory Visit: Payer: Self-pay | Admitting: Urology

## 2015-12-23 NOTE — Patient Instructions (Addendum)
Joan Ramirez  12/23/2015   Your procedure is scheduled on: 01/01/2016    Report to [facility]  Entrance take [place]  elevators to 3rd floor to  [place] at   [address] AM.  Call this number if you have problems the morning of surgery [phone]   Remember: ONLY 1 PERSON MAY GO WITH YOU TO SHORT STAY TO GET  READY MORNING OF [place].  Do not eat food or drink liquids :After Midnight.             Clear liquid diet on 12/31/2015.                Fleets enema nite before surgery.                Magnesium Citrate - Drink at 12 noon day before surgery.      Take these medicines the morning of surgery with A SIP OF WATER: none  DO NOT TAKE ANY DIABETIC MEDICATIONS DAY OF YOUR SURGERY                               You may not have any metal on your body including hair pins and              piercings  Do not wear jewelry, make-up, lotions, powders or perfumes, deodorant             Do not wear nail polish.  Do not shave  48 hours prior to surgery.                Do not bring valuables to the hospital. Joan Ramirez.  Contacts, dentures or bridgework may not be worn into surgery.  Leave suitcase in the car. After surgery it may be brought to your room.      Incentive Spirometer  An incentive spirometer is a tool that can help keep your lungs clear and active. This tool measures how well you are filling your lungs with each breath. Taking long deep breaths may help reverse or decrease the chance of developing breathing (pulmonary) problems (especially infection) following:  A long period of time when you are unable to move or be active. BEFORE THE PROCEDURE   If the spirometer includes an indicator to show your best effort, your nurse or respiratory therapist will set it to a desired goal.  If possible, sit up straight or lean slightly forward. Try not to slouch.  Hold the incentive spirometer in an upright  position. INSTRUCTIONS FOR USE  1. Sit on the edge of your bed if possible, or sit up as far as you can in bed or on a chair. 2. Hold the incentive spirometer in an upright position. 3. Breathe out normally. 4. Place the mouthpiece in your mouth and seal your lips tightly around it. 5. Breathe in slowly and as deeply as possible, raising the piston or the ball toward the top of the column. 6. Hold your breath for 3-5 seconds or for as long as possible. Allow the piston or ball to fall to the bottom of the column. 7. Remove the mouthpiece from your mouth and breathe out normally. 8. Rest for a few seconds and repeat Steps 1 through 7  at least 10 times every 1-2 hours when you are awake. Take your time and take a few normal breaths between deep breaths. 9. The spirometer may include an indicator to show your best effort. Use the indicator as a goal to work toward during each repetition. 10. After each set of 10 deep breaths, practice coughing to be sure your lungs are clear. If you have an incision (the cut made at the time of surgery), support your incision when coughing by placing a pillow or rolled up towels firmly against it. Once you are able to get out of bed, walk around indoors and cough well. You may stop using the incentive spirometer when instructed by your caregiver.  RISKS AND COMPLICATIONS  Take your time so you do not get dizzy or light-headed.  If you are in pain, you may need to take or ask for pain medication before doing incentive spirometry. It is harder to take a deep breath if you are having pain. AFTER USE  Rest and breathe slowly and easily.  It can be helpful to keep track of a log of your progress. Your caregiver can provide you with a simple table to help with this. If you are using the spirometer at home, follow these instructions: [place] IF:   You are having difficultly using the spirometer.  You have trouble using the spirometer as often as  instructed.  Your pain medication is not giving enough relief while using the spirometer.  You develop fever of 100.5 F (38.1 C) or higher. SEEK IMMEDIATE MEDICAL CARE IF:   You cough up bloody sputum that had not been present before.  You develop fever of 102 F (38.9 C) or greater.  You develop worsening pain at or near the incision site. MAKE SURE YOU:   Understand these instructions.  Will watch your condition.  Will get help right away if you are not doing well or get worse. Document Released: 10/24/2006 Document Revised: 09/05/2011 Document Reviewed: 12/25/2006 [employer] Patient Information 2014 [employer], [place].   ________________________________________________________________________               Please read over the following fact sheets you were given: _____________________________________________________________________                CLEAR LIQUID DIET   Foods Allowed                                                                     Foods Excluded  Coffee and tea, regular and decaf                             liquids that you cannot  Plain Jell-O in any flavor                                             see through such as: Fruit ices (not with fruit pulp)  milk, soups, orange juice  Iced Popsicles                                    All solid food Carbonated beverages, regular and diet                                    Cranberry, grape and apple juices Sports drinks like Gatorade Lightly seasoned clear broth or consume(fat free) Sugar, honey syrup  Sample Menu Breakfast                                Lunch                                     Supper Cranberry juice                    Beef broth                            Chicken broth Jell-O                                     Grape juice                           Apple juice Coffee or tea                        Jell-O                                       Popsicle                                                Coffee or tea                        Coffee or tea  _____________________________________________________________________  [facility] - Preparing for Surgery Before surgery, you can play an important role.  Because skin is not sterile, your skin needs to be as free of germs as possible.  You can reduce the number of germs on your skin by washing with CHG (chlorahexidine gluconate) soap before surgery.  CHG is an antiseptic cleaner which kills germs and bonds with the skin to continue killing germs even after washing. Please DO NOT use if you have an allergy to CHG or antibacterial soaps.  If your skin becomes reddened/irritated stop using the CHG and inform your nurse when you arrive at Short Stay. Do not shave (including legs and underarms) for at least 48 hours prior to the first CHG shower.  You may shave your face/neck. Please follow these instructions carefully:  1.  Shower with CHG Soap the night before surgery and the  morning of Surgery.  2.  If you choose to  wash your hair, wash your hair first as usual with your  normal  shampoo.  3.  After you shampoo, rinse your hair and body thoroughly to remove the  shampoo.                           4.  Use CHG as you would any other liquid soap.  You can apply chg directly  to the skin and wash                       Gently with a scrungie or clean washcloth.  5.  Apply the CHG Soap to your body ONLY FROM THE NECK DOWN.   Do not use on face/ open                           Wound or open sores. Avoid contact with eyes, ears mouth and genitals (private parts).                       Wash face,  Genitals (private parts) with your normal soap.             6.  Wash thoroughly, paying special attention to the area where your surgery  will be performed.  7.  Thoroughly rinse your body with warm water from the neck down.  8.  DO NOT shower/wash with your normal soap after using and rinsing off  the CHG  Soap.                9.  Pat yourself dry with a clean towel.            10.  Wear clean pajamas.            11.  Place clean sheets on your bed the night of your first shower and do not  sleep with pets. Day of Surgery : Do not apply any lotions/deodorants the morning of surgery.  Please wear clean clothes to the hospital/surgery center.  FAILURE TO FOLLOW THESE INSTRUCTIONS MAY RESULT IN THE CANCELLATION OF YOUR SURGERY PATIENT SIGNATURE_________________________________  NURSE SIGNATURE__________________________________  ________________________________________________________________________  WHAT IS A BLOOD TRANSFUSION? Blood Transfusion Information  A transfusion is the replacement of blood or some of its parts. Blood is made up of multiple cells which provide different functions.  Red blood cells carry oxygen and are used for blood loss replacement.  White blood cells fight against infection.  Platelets control bleeding.  Plasma helps clot blood.  Other blood products are available for specialized needs, such as hemophilia or other clotting disorders. BEFORE THE TRANSFUSION  Who gives blood for transfusions?   Healthy volunteers who are fully evaluated to make sure their blood is safe. This is blood bank blood. Transfusion therapy is the safest it has ever been in the practice of medicine. Before blood is taken from a donor, a complete history is taken to make sure that person has no history of diseases nor engages in risky social behavior (examples are intravenous drug use or sexual activity with multiple partners). The donor's travel history is screened to minimize risk of transmitting infections, such as malaria. The donated blood is tested for signs of infectious diseases, such as HIV and hepatitis. The blood is then tested to be sure it is compatible with you in order to minimize the chance of a transfusion reaction. If you or a  relative donates blood, this is often done in  anticipation of surgery and is not appropriate for emergency situations. It takes many days to process the donated blood. RISKS AND COMPLICATIONS Although transfusion therapy is very safe and saves many lives, the main dangers of transfusion include:  11. Getting an infectious disease. 12. Developing a transfusion reaction. This is an allergic reaction to something in the blood you were given. Every precaution is taken to prevent this. The decision to have a blood transfusion has been considered carefully by your caregiver before blood is given. Blood is not given unless the benefits outweigh the risks. AFTER THE TRANSFUSION  Right after receiving a blood transfusion, you will usually feel much better and more energetic. This is especially true if your red blood cells have gotten low (anemic). The transfusion raises the level of the red blood cells which carry oxygen, and this usually causes an energy increase.  The nurse administering the transfusion will monitor you carefully for complications. HOME CARE INSTRUCTIONS  No special instructions are needed after a transfusion. You may find your energy is better. Speak with your caregiver about any limitations on activity for underlying diseases you may have. SEEK MEDICAL CARE IF:   Your condition is not improving after your transfusion.  You develop redness or irritation at the intravenous (IV) site. SEEK IMMEDIATE MEDICAL CARE IF:  Any of the following symptoms occur over the next 12 hours:  Shaking chills.  You have a temperature by mouth above 102 F (38.9 C), not controlled by medicine.  Chest, back, or muscle pain.  People around you feel you are not acting correctly or are confused.  Shortness of breath or difficulty breathing.  Dizziness and fainting.  You get a rash or develop hives.  You have a decrease in urine output.  Your urine turns a dark color or changes to pink, red, or brown. Any of the following symptoms occur  over the next 10 days:  You have a temperature by mouth above 102 F (38.9 C), not controlled by medicine.  Shortness of breath.  Weakness after normal activity.  The white part of the eye turns yellow (jaundice).  You have a decrease in the amount of urine or are urinating less often.  Your urine turns a dark color or changes to pink, red, or brown. Document Released: 06/10/2000 Document Revised: 09/05/2011 Document Reviewed: 01/28/2008 [facility] Patient Information 2014 [place], [place].  _______________________________________________________________________

## 2015-12-25 ENCOUNTER — Encounter (HOSPITAL_COMMUNITY)
Admission: RE | Admit: 2015-12-25 | Discharge: 2015-12-25 | Disposition: A | Discharge status: Home / Self Care | Payer: Commercial Managed Care - PPO | Source: Ambulatory Visit | Attending: Urology | Admitting: Urology

## 2015-12-25 ENCOUNTER — Encounter (HOSPITAL_COMMUNITY): Payer: Self-pay

## 2015-12-25 DIAGNOSIS — C689 Malignant neoplasm of urinary organ, unspecified: Secondary | ICD-10-CM | POA: Insufficient documentation

## 2015-12-25 DIAGNOSIS — Z0183 Encounter for blood typing: Secondary | ICD-10-CM | POA: Diagnosis not present

## 2015-12-25 DIAGNOSIS — Z01812 Encounter for preprocedural laboratory examination: Secondary | ICD-10-CM | POA: Insufficient documentation

## 2015-12-25 HISTORY — DX: Adverse effect of unspecified anesthetic, initial encounter: T41.45XA

## 2015-12-25 HISTORY — DX: Bronchitis, not specified as acute or chronic: J40

## 2015-12-25 HISTORY — DX: Nausea with vomiting, unspecified: R11.2

## 2015-12-25 HISTORY — DX: Other complications of anesthesia, initial encounter: T88.59XA

## 2015-12-25 HISTORY — DX: Other specified postprocedural states: Z98.890

## 2015-12-25 HISTORY — DX: Pneumonia, unspecified organism: J18.9

## 2015-12-25 HISTORY — DX: Malignant (primary) neoplasm, unspecified: C80.1

## 2015-12-25 LAB — CBC
HEMATOCRIT: 44.6 % (ref 36.0–46.0)
HEMOGLOBIN: 14.8 g/dL (ref 12.0–15.0)
MCH: 31.5 pg (ref 26.0–34.0)
MCHC: 33.2 g/dL (ref 30.0–36.0)
MCV: 94.9 fL (ref 78.0–100.0)
Platelets: 369 10*3/uL (ref 150–400)
RBC: 4.7 MIL/uL (ref 3.87–5.11)
RDW: 12.8 % (ref 11.5–15.5)
WBC: 12.6 10*3/uL — AB (ref 4.0–10.5)

## 2015-12-25 LAB — BASIC METABOLIC PANEL
ANION GAP: 10 (ref 5–15)
BUN: 18 mg/dL (ref 6–20)
CALCIUM: 8.9 mg/dL (ref 8.9–10.3)
CO2: 23 mmol/L (ref 22–32)
Chloride: 103 mmol/L (ref 101–111)
Creatinine, Ser: 0.71 mg/dL (ref 0.44–1.00)
GFR calc Af Amer: >60 mL/min (ref >60)
Glucose, Bld: 105 mg/dL — ABNORMAL HIGH (ref 65–99)
POTASSIUM: 4 mmol/L (ref 3.5–5.1)
SODIUM: 136 mmol/L (ref 135–145)

## 2015-12-25 LAB — ABO/RH: ABO/RH(D): O POS

## 2015-12-25 NOTE — Pre-Procedure Instructions (Addendum)
EKG 10-30-15 epic Medical Clearance on chart

## 2015-12-26 LAB — URINE CULTURE

## 2015-12-26 LAB — HEMOGLOBIN A1C
HEMOGLOBIN A1C: 7 % — AB (ref 4.8–5.6)
MEAN PLASMA GLUCOSE: 154 mg/dL

## 2015-12-28 ENCOUNTER — Encounter (HOSPITAL_COMMUNITY): Payer: Commercial Managed Care - PPO

## 2015-12-28 NOTE — Pre-Procedure Instructions (Signed)
12-28-15 1445 "Multiple species present" with urine culture collection 12-25-15 , recollect AM of preop.

## 2016-01-01 ENCOUNTER — Encounter (HOSPITAL_COMMUNITY): Payer: Self-pay | Admitting: *Deleted

## 2016-01-01 ENCOUNTER — Inpatient Hospital Stay (HOSPITAL_COMMUNITY): Payer: Commercial Managed Care - PPO | Admitting: Certified Registered Nurse Anesthetist

## 2016-01-01 ENCOUNTER — Inpatient Hospital Stay (HOSPITAL_COMMUNITY)
Admission: RE | Admit: 2016-01-01 | Discharge: 2016-01-03 | DRG: 657 | Disposition: A | Discharge status: Home / Self Care | Payer: Commercial Managed Care - PPO | Source: Ambulatory Visit | Attending: Urology | Admitting: Urology

## 2016-01-01 ENCOUNTER — Encounter (HOSPITAL_COMMUNITY)
Admission: RE | Disposition: A | Discharge status: Home / Self Care | Payer: Self-pay | Source: Ambulatory Visit | Attending: Urology

## 2016-01-01 DIAGNOSIS — E119 Type 2 diabetes mellitus without complications: Secondary | ICD-10-CM | POA: Diagnosis present

## 2016-01-01 DIAGNOSIS — N132 Hydronephrosis with renal and ureteral calculous obstruction: Secondary | ICD-10-CM | POA: Diagnosis present

## 2016-01-01 DIAGNOSIS — F17210 Nicotine dependence, cigarettes, uncomplicated: Secondary | ICD-10-CM | POA: Diagnosis present

## 2016-01-01 DIAGNOSIS — Z801 Family history of malignant neoplasm of trachea, bronchus and lung: Secondary | ICD-10-CM

## 2016-01-01 DIAGNOSIS — C652 Malignant neoplasm of left renal pelvis: Secondary | ICD-10-CM | POA: Diagnosis present

## 2016-01-01 DIAGNOSIS — K219 Gastro-esophageal reflux disease without esophagitis: Secondary | ICD-10-CM | POA: Diagnosis present

## 2016-01-01 DIAGNOSIS — C642 Malignant neoplasm of left kidney, except renal pelvis: Secondary | ICD-10-CM

## 2016-01-01 DIAGNOSIS — C649 Malignant neoplasm of unspecified kidney, except renal pelvis: Secondary | ICD-10-CM | POA: Diagnosis present

## 2016-01-01 DIAGNOSIS — Z807 Family history of other malignant neoplasms of lymphoid, hematopoietic and related tissues: Secondary | ICD-10-CM

## 2016-01-01 DIAGNOSIS — Z833 Family history of diabetes mellitus: Secondary | ICD-10-CM | POA: Diagnosis not present

## 2016-01-01 DIAGNOSIS — K660 Peritoneal adhesions (postprocedural) (postinfection): Secondary | ICD-10-CM | POA: Diagnosis present

## 2016-01-01 DIAGNOSIS — C659 Malignant neoplasm of unspecified renal pelvis: Secondary | ICD-10-CM | POA: Diagnosis present

## 2016-01-01 DIAGNOSIS — C52 Malignant neoplasm of vagina: Secondary | ICD-10-CM | POA: Diagnosis present

## 2016-01-01 HISTORY — PX: CYSTOSCOPY: SHX5120

## 2016-01-01 HISTORY — PX: ROBOT ASSITED LAPAROSCOPIC NEPHROURETERECTOMY: SHX6077

## 2016-01-01 LAB — CBC
HEMATOCRIT: 40.1 % (ref 36.0–46.0)
HEMOGLOBIN: 13.6 g/dL (ref 12.0–15.0)
MCH: 31.8 pg (ref 26.0–34.0)
MCHC: 33.9 g/dL (ref 30.0–36.0)
MCV: 93.7 fL (ref 78.0–100.0)
Platelets: 320 10*3/uL (ref 150–400)
RBC: 4.28 MIL/uL (ref 3.87–5.11)
RDW: 12.7 % (ref 11.5–15.5)
WBC: 25 10*3/uL — ABNORMAL HIGH (ref 4.0–10.5)

## 2016-01-01 LAB — BASIC METABOLIC PANEL
Anion gap: 6 (ref 5–15)
BUN: 16 mg/dL (ref 6–20)
CHLORIDE: 104 mmol/L (ref 101–111)
CO2: 24 mmol/L (ref 22–32)
Calcium: 7.9 mg/dL — ABNORMAL LOW (ref 8.9–10.3)
Creatinine, Ser: 0.9 mg/dL (ref 0.44–1.00)
GFR calc Af Amer: >60 mL/min (ref >60)
GFR calc non Af Amer: >60 mL/min (ref >60)
GLUCOSE: 229 mg/dL — AB (ref 65–99)
POTASSIUM: 4.7 mmol/L (ref 3.5–5.1)
SODIUM: 134 mmol/L — AB (ref 135–145)

## 2016-01-01 LAB — TYPE AND SCREEN
ABO/RH(D): O POS
ANTIBODY SCREEN: NEGATIVE

## 2016-01-01 LAB — GLUCOSE, CAPILLARY
GLUCOSE-CAPILLARY: 233 mg/dL — AB (ref 65–99)
GLUCOSE-CAPILLARY: 218 mg/dL — AB (ref 65–99)
GLUCOSE-CAPILLARY: 158 mg/dL — AB (ref 65–99)
Glucose-Capillary: 150 mg/dL — ABNORMAL HIGH (ref 65–99)
Glucose-Capillary: 161 mg/dL — ABNORMAL HIGH (ref 65–99)

## 2016-01-01 SURGERY — NEPHROURETERECTOMY, ROBOT-ASSISTED, LAPAROSCOPIC
Anesthesia: General

## 2016-01-01 MED ORDER — ACETAMINOPHEN 10 MG/ML IV SOLN
1000.0000 mg | Freq: Four times a day (QID) | INTRAVENOUS | Status: AC
  Administered 2016-01-01 – 2016-01-02 (×4): 1000 mg via INTRAVENOUS
  Filled 2016-01-01 (×4): qty 100

## 2016-01-01 MED ORDER — ACETAMINOPHEN 10 MG/ML IV SOLN
INTRAVENOUS | Status: DC | PRN
  Administered 2016-01-01: 1000 mg via INTRAVENOUS

## 2016-01-01 MED ORDER — ROCURONIUM BROMIDE 100 MG/10ML IV SOLN
INTRAVENOUS | Status: DC | PRN
  Administered 2016-01-01 (×2): 10 mg via INTRAVENOUS
  Administered 2016-01-01: 50 mg via INTRAVENOUS
  Administered 2016-01-01: 10 mg via INTRAVENOUS
  Administered 2016-01-01: 20 mg via INTRAVENOUS
  Administered 2016-01-01 (×3): 10 mg via INTRAVENOUS

## 2016-01-01 MED ORDER — MAGNESIUM CITRATE PO SOLN
1.0000 | Freq: Once | ORAL | Status: DC
  Filled 2016-01-01: qty 296

## 2016-01-01 MED ORDER — METOCLOPRAMIDE HCL 5 MG/ML IJ SOLN
INTRAMUSCULAR | Status: AC
  Filled 2016-01-01: qty 2

## 2016-01-01 MED ORDER — LIDOCAINE HCL (CARDIAC) 20 MG/ML IV SOLN
INTRAVENOUS | Status: DC | PRN
  Administered 2016-01-01: 100 mg via INTRAVENOUS

## 2016-01-01 MED ORDER — CEFAZOLIN SODIUM-DEXTROSE 2-4 GM/100ML-% IV SOLN
INTRAVENOUS | Status: AC
  Filled 2016-01-01: qty 100

## 2016-01-01 MED ORDER — SUGAMMADEX SODIUM 200 MG/2ML IV SOLN
INTRAVENOUS | Status: AC
  Filled 2016-01-01: qty 2

## 2016-01-01 MED ORDER — SENNA 8.6 MG PO TABS
1.0000 | ORAL_TABLET | Freq: Two times a day (BID) | ORAL | Status: DC
  Administered 2016-01-01 – 2016-01-03 (×4): 8.6 mg via ORAL
  Filled 2016-01-01 (×4): qty 1

## 2016-01-01 MED ORDER — ALBUTEROL SULFATE HFA 108 (90 BASE) MCG/ACT IN AERS
INHALATION_SPRAY | RESPIRATORY_TRACT | Status: DC | PRN
  Administered 2016-01-01: 5 via RESPIRATORY_TRACT
  Administered 2016-01-01: 4 via RESPIRATORY_TRACT

## 2016-01-01 MED ORDER — HYDROMORPHONE HCL 1 MG/ML IJ SOLN
0.2500 mg | INTRAMUSCULAR | Status: DC | PRN
  Administered 2016-01-01 (×2): 0.5 mg via INTRAVENOUS

## 2016-01-01 MED ORDER — DEXAMETHASONE SODIUM PHOSPHATE 10 MG/ML IJ SOLN
INTRAMUSCULAR | Status: DC | PRN
  Administered 2016-01-01: 10 mg via INTRAVENOUS

## 2016-01-01 MED ORDER — MEPERIDINE HCL 50 MG/ML IJ SOLN: 6.2500 mg | INTRAMUSCULAR | Status: DC | PRN

## 2016-01-01 MED ORDER — FLEET ENEMA 7-19 GM/118ML RE ENEM: 1.0000 | ENEMA | Freq: Once | RECTAL | Status: DC

## 2016-01-01 MED ORDER — OXYCODONE-ACETAMINOPHEN 5-325 MG PO TABS: 1.0000 | ORAL_TABLET | ORAL | Status: DC | PRN

## 2016-01-01 MED ORDER — OXYCODONE HCL 5 MG PO TABS
5.0000 mg | ORAL_TABLET | ORAL | Status: DC | PRN
  Administered 2016-01-02 – 2016-01-03 (×4): 10 mg via ORAL
  Filled 2016-01-01 (×4): qty 2

## 2016-01-01 MED ORDER — HYDROMORPHONE HCL 1 MG/ML IJ SOLN: 0.5000 mg | INTRAMUSCULAR | Status: DC | PRN

## 2016-01-01 MED ORDER — SCOPOLAMINE 1 MG/3DAYS TD PT72
MEDICATED_PATCH | TRANSDERMAL | Status: AC
  Filled 2016-01-01: qty 1

## 2016-01-01 MED ORDER — ONDANSETRON HCL 4 MG/2ML IJ SOLN
INTRAMUSCULAR | Status: DC | PRN
  Administered 2016-01-01: 4 mg via INTRAVENOUS

## 2016-01-01 MED ORDER — CIPROFLOXACIN IN D5W 400 MG/200ML IV SOLN
INTRAVENOUS | Status: AC
  Filled 2016-01-01: qty 200

## 2016-01-01 MED ORDER — LIDOCAINE HCL (CARDIAC) 20 MG/ML IV SOLN
INTRAVENOUS | Status: AC
  Filled 2016-01-01: qty 5

## 2016-01-01 MED ORDER — BUPIVACAINE-EPINEPHRINE 0.25% -1:200000 IJ SOLN
INTRAMUSCULAR | Status: AC
  Filled 2016-01-01: qty 1

## 2016-01-01 MED ORDER — CIPROFLOXACIN IN D5W 400 MG/200ML IV SOLN
400.0000 mg | Freq: Two times a day (BID) | INTRAVENOUS | Status: AC
  Administered 2016-01-01 – 2016-01-02 (×3): 400 mg via INTRAVENOUS
  Filled 2016-01-01 (×3): qty 200

## 2016-01-01 MED ORDER — SUGAMMADEX SODIUM 500 MG/5ML IV SOLN
INTRAVENOUS | Status: DC | PRN
  Administered 2016-01-01: 200 mg via INTRAVENOUS

## 2016-01-01 MED ORDER — MIDAZOLAM HCL 5 MG/5ML IJ SOLN
INTRAMUSCULAR | Status: DC | PRN
  Administered 2016-01-01: 1.5 mg via INTRAVENOUS
  Administered 2016-01-01: 0.5 mg via INTRAVENOUS

## 2016-01-01 MED ORDER — METOCLOPRAMIDE HCL 5 MG/ML IJ SOLN
INTRAMUSCULAR | Status: DC | PRN
  Administered 2016-01-01: 5 mg via INTRAVENOUS

## 2016-01-01 MED ORDER — DEXAMETHASONE SODIUM PHOSPHATE 10 MG/ML IJ SOLN
INTRAMUSCULAR | Status: AC
  Filled 2016-01-01: qty 1

## 2016-01-01 MED ORDER — ACETAMINOPHEN 10 MG/ML IV SOLN
INTRAVENOUS | Status: AC
  Filled 2016-01-01: qty 100

## 2016-01-01 MED ORDER — CIPROFLOXACIN IN D5W 400 MG/200ML IV SOLN
400.0000 mg | INTRAVENOUS | Status: AC
  Administered 2016-01-01: 400 mg via INTRAVENOUS

## 2016-01-01 MED ORDER — PROMETHAZINE HCL 25 MG/ML IJ SOLN: 6.2500 mg | INTRAMUSCULAR | Status: DC | PRN

## 2016-01-01 MED ORDER — FENTANYL CITRATE (PF) 250 MCG/5ML IJ SOLN
INTRAMUSCULAR | Status: AC
  Filled 2016-01-01: qty 5

## 2016-01-01 MED ORDER — LACTATED RINGERS IV SOLN
INTRAVENOUS | Status: DC
Start: 2016-01-01 — End: 2016-01-03
  Administered 2016-01-01 – 2016-01-02 (×3): via INTRAVENOUS

## 2016-01-01 MED ORDER — LACTATED RINGERS IR SOLN
Status: DC | PRN
  Administered 2016-01-01: 1000 mL

## 2016-01-01 MED ORDER — LABETALOL HCL 5 MG/ML IV SOLN
INTRAVENOUS | Status: DC | PRN
  Administered 2016-01-01: 2.5 mg via INTRAVENOUS

## 2016-01-01 MED ORDER — ATROPINE SULFATE 0.4 MG/ML IJ SOLN
INTRAMUSCULAR | Status: AC
  Filled 2016-01-01: qty 1

## 2016-01-01 MED ORDER — HYDROMORPHONE HCL 1 MG/ML IJ SOLN
INTRAMUSCULAR | Status: AC
  Filled 2016-01-01: qty 1

## 2016-01-01 MED ORDER — CEFAZOLIN SODIUM-DEXTROSE 2-4 GM/100ML-% IV SOLN
2.0000 g | INTRAVENOUS | Status: AC
  Administered 2016-01-01 (×2): 2 g via INTRAVENOUS
  Filled 2016-01-01: qty 100

## 2016-01-01 MED ORDER — 0.9 % SODIUM CHLORIDE (POUR BTL) OPTIME
TOPICAL | Status: DC | PRN
  Administered 2016-01-01: 1000 mL

## 2016-01-01 MED ORDER — ARTIFICIAL TEARS OP OINT
TOPICAL_OINTMENT | OPHTHALMIC | Status: AC
  Filled 2016-01-01: qty 3.5

## 2016-01-01 MED ORDER — LACTATED RINGERS IV SOLN: INTRAVENOUS | Status: DC

## 2016-01-01 MED ORDER — ROCURONIUM BROMIDE 100 MG/10ML IV SOLN
INTRAVENOUS | Status: AC
  Filled 2016-01-01: qty 1

## 2016-01-01 MED ORDER — EPHEDRINE SULFATE 50 MG/ML IJ SOLN
INTRAMUSCULAR | Status: AC
  Filled 2016-01-01: qty 1

## 2016-01-01 MED ORDER — SCOPOLAMINE 1 MG/3DAYS TD PT72
MEDICATED_PATCH | TRANSDERMAL | Status: DC | PRN
  Administered 2016-01-01: 1 via TRANSDERMAL

## 2016-01-01 MED ORDER — LACTATED RINGERS IV SOLN
INTRAVENOUS | Status: DC | PRN
  Administered 2016-01-01 (×3): via INTRAVENOUS

## 2016-01-01 MED ORDER — ONDANSETRON 4 MG PO TBDP: 4.0000 mg | ORAL_TABLET | Freq: Four times a day (QID) | ORAL | Status: DC | PRN

## 2016-01-01 MED ORDER — INSULIN ASPART 100 UNIT/ML ~~LOC~~ SOLN
0.0000 [IU] | SUBCUTANEOUS | Status: DC
  Administered 2016-01-01: 8 [IU] via SUBCUTANEOUS
  Administered 2016-01-01: 2 [IU] via SUBCUTANEOUS
  Administered 2016-01-01 – 2016-01-02 (×2): 4 [IU] via SUBCUTANEOUS
  Administered 2016-01-02 – 2016-01-03 (×3): 2 [IU] via SUBCUTANEOUS

## 2016-01-01 MED ORDER — FENTANYL CITRATE (PF) 100 MCG/2ML IJ SOLN
INTRAMUSCULAR | Status: DC | PRN
  Administered 2016-01-01 (×3): 50 ug via INTRAVENOUS
  Administered 2016-01-01: 100 ug via INTRAVENOUS
  Administered 2016-01-01 (×2): 50 ug via INTRAVENOUS

## 2016-01-01 MED ORDER — MIDAZOLAM HCL 2 MG/2ML IJ SOLN
INTRAMUSCULAR | Status: AC
  Filled 2016-01-01: qty 2

## 2016-01-01 MED ORDER — BUPIVACAINE-EPINEPHRINE 0.25% -1:200000 IJ SOLN
INTRAMUSCULAR | Status: DC | PRN
  Administered 2016-01-01: 20 mL

## 2016-01-01 MED ORDER — STERILE WATER FOR IRRIGATION IR SOLN
Status: DC | PRN
  Administered 2016-01-01: 1000 mL

## 2016-01-01 MED ORDER — PROPOFOL 10 MG/ML IV BOLUS
INTRAVENOUS | Status: DC | PRN
  Administered 2016-01-01: 150 mg via INTRAVENOUS

## 2016-01-01 MED ORDER — BUPIVACAINE LIPOSOME 1.3 % IJ SUSP
20.0000 mL | Freq: Once | INTRAMUSCULAR | Status: AC
Start: 2016-01-01 — End: 2016-01-01
  Administered 2016-01-01: 20 mL
  Filled 2016-01-01: qty 20

## 2016-01-01 MED ORDER — CHLORHEXIDINE GLUCONATE 4 % EX LIQD: Freq: Once | CUTANEOUS | Status: DC

## 2016-01-01 MED ORDER — SODIUM CHLORIDE 0.9 % IR SOLN
Status: DC | PRN
  Administered 2016-01-01: 1000 mL

## 2016-01-01 MED ORDER — ONDANSETRON HCL 4 MG/2ML IJ SOLN
INTRAMUSCULAR | Status: AC
Start: 2016-01-01 — End: 2016-01-01
  Filled 2016-01-01: qty 2

## 2016-01-01 MED ORDER — ONDANSETRON HCL 4 MG/2ML IJ SOLN
4.0000 mg | Freq: Four times a day (QID) | INTRAMUSCULAR | Status: DC | PRN
  Administered 2016-01-01 – 2016-01-02 (×2): 4 mg via INTRAVENOUS
  Filled 2016-01-01 (×4): qty 2

## 2016-01-01 MED ORDER — DOCUSATE SODIUM 100 MG PO CAPS
100.0000 mg | ORAL_CAPSULE | Freq: Two times a day (BID) | ORAL | Status: DC
  Administered 2016-01-01 – 2016-01-03 (×4): 100 mg via ORAL
  Filled 2016-01-01 (×4): qty 1

## 2016-01-01 MED ORDER — FENTANYL CITRATE (PF) 100 MCG/2ML IJ SOLN
INTRAMUSCULAR | Status: AC
Start: 2016-01-01 — End: 2016-01-01
  Filled 2016-01-01: qty 2

## 2016-01-01 MED ORDER — PROPOFOL 10 MG/ML IV BOLUS
INTRAVENOUS | Status: AC
  Filled 2016-01-01: qty 20

## 2016-01-01 MED ORDER — SODIUM CHLORIDE 0.9 % IJ SOLN
INTRAMUSCULAR | Status: AC
  Filled 2016-01-01: qty 20

## 2016-01-01 SURGICAL SUPPLY — 65 items
BAG LAPAROSCOPIC 12 15 PORT 16 (BASKET) ×2 IMPLANT
BAG RETRIEVAL 12/15 (BASKET) ×3
CATH FOLEY 3WAY  5CC 18FR (CATHETERS) ×1
CATH FOLEY 3WAY 5CC 18FR (CATHETERS) ×2 IMPLANT
CHLORAPREP W/TINT 26ML (MISCELLANEOUS) ×3 IMPLANT
CLIP LIGATING HEM O LOK PURPLE (MISCELLANEOUS) ×6 IMPLANT
CLIP LIGATING HEMO LOK XL GOLD (MISCELLANEOUS) IMPLANT
CLIP LIGATING HEMO O LOK GREEN (MISCELLANEOUS) ×3 IMPLANT
COVER SURGICAL LIGHT HANDLE (MISCELLANEOUS) IMPLANT
COVER TIP SHEARS 8 DVNC (MISCELLANEOUS) ×2 IMPLANT
COVER TIP SHEARS 8MM DA VINCI (MISCELLANEOUS) ×1
CUTTER FLEX LINEAR 45M (STAPLE) ×3 IMPLANT
DECANTER SPIKE VIAL GLASS SM (MISCELLANEOUS) ×3 IMPLANT
DRAIN CHANNEL 15F RND FF 3/16 (WOUND CARE) ×3 IMPLANT
DRAPE ARM DVNC X/XI (DISPOSABLE) ×8 IMPLANT
DRAPE COLUMN DVNC XI (DISPOSABLE) ×2 IMPLANT
DRAPE DA VINCI XI ARM (DISPOSABLE) ×4
DRAPE DA VINCI XI COLUMN (DISPOSABLE) ×1
DRAPE INCISE IOBAN 66X45 STRL (DRAPES) ×3 IMPLANT
DRAPE SHEET LG 3/4 BI-LAMINATE (DRAPES) ×3 IMPLANT
DRSG TEGADERM 4X4.75 (GAUZE/BANDAGES/DRESSINGS) IMPLANT
DRSG TEGADERM 6X8 (GAUZE/BANDAGES/DRESSINGS) ×3 IMPLANT
ELECT PENCIL ROCKER SW 15FT (MISCELLANEOUS) ×3 IMPLANT
ELECT REM PT RETURN 9FT ADLT (ELECTROSURGICAL) ×3
ELECTRODE REM PT RTRN 9FT ADLT (ELECTROSURGICAL) ×2 IMPLANT
EVACUATOR SILICONE 100CC (DRAIN) ×3 IMPLANT
GAUZE SPONGE 2X2 8PLY STRL LF (GAUZE/BANDAGES/DRESSINGS) IMPLANT
GLOVE BIO SURGEON STRL SZ 6.5 (GLOVE) ×6 IMPLANT
GLOVE BIOGEL M STRL SZ7.5 (GLOVE) ×6 IMPLANT
GOWN STRL REUS W/TWL LRG LVL3 (GOWN DISPOSABLE) ×9 IMPLANT
HEMOSTAT SURGICEL 4X8 (HEMOSTASIS) ×3 IMPLANT
KIT BASIN OR (CUSTOM PROCEDURE TRAY) ×3 IMPLANT
LIQUID BAND (GAUZE/BANDAGES/DRESSINGS) ×3 IMPLANT
LOOP VESSEL MAXI BLUE (MISCELLANEOUS) ×3 IMPLANT
PEN SKIN MARKING BROAD (MISCELLANEOUS) ×3 IMPLANT
PLUG CATH AND CAP STER (CATHETERS) IMPLANT
POSITIONER SURGICAL ARM (MISCELLANEOUS) ×6 IMPLANT
POUCH ENDO CATCH II 15MM (MISCELLANEOUS) IMPLANT
RELOAD 45 VASCULAR/THIN (ENDOMECHANICALS) ×9 IMPLANT
SEAL CANN UNIV 5-8 DVNC XI (MISCELLANEOUS) ×8 IMPLANT
SEAL XI 5MM-8MM UNIVERSAL (MISCELLANEOUS) ×4
SET TUBE IRRIG SUCTION NO TIP (IRRIGATION / IRRIGATOR) ×3 IMPLANT
SET WARMING FLUID IRRIGATION (MISCELLANEOUS) ×6 IMPLANT
SOLUTION ELECTROLUBE (MISCELLANEOUS) ×3 IMPLANT
SPONGE GAUZE 2X2 STER 10/PKG (GAUZE/BANDAGES/DRESSINGS)
SUT ETHILON 3 0 PS 1 (SUTURE) ×3 IMPLANT
SUT MNCRL AB 4-0 PS2 18 (SUTURE) ×6 IMPLANT
SUT PDS AB 1 CTX 36 (SUTURE) ×6 IMPLANT
SUT V-LOC BARB 180 2/0GR6 GS22 (SUTURE) ×6
SUT VIC AB 0 UR5 27 (SUTURE) IMPLANT
SUT VIC AB 3-0 SH 27 (SUTURE) ×1
SUT VIC AB 3-0 SH 27XBRD (SUTURE) ×2 IMPLANT
SUT VICRYL 0 UR6 27IN ABS (SUTURE) ×6 IMPLANT
SUT VLOC BARB 180 ABS3/0GR12 (SUTURE) ×3
SUTURE V-LC BRB 180 2/0GR6GS22 (SUTURE) ×4 IMPLANT
SUTURE VLOC BRB 180 ABS3/0GR12 (SUTURE) ×2 IMPLANT
SYR BULB IRRIGATION 50ML (SYRINGE) ×3 IMPLANT
TAPE STRIPS DRAPE STRL (GAUZE/BANDAGES/DRESSINGS) ×3 IMPLANT
TOWEL OR NON WOVEN STRL DISP B (DISPOSABLE) ×3 IMPLANT
TRAY FOLEY W/METER SILVER 14FR (SET/KITS/TRAYS/PACK) ×3 IMPLANT
TRAY FOLEY W/METER SILVER 16FR (SET/KITS/TRAYS/PACK) ×3 IMPLANT
TRAY LAPAROSCOPIC (CUSTOM PROCEDURE TRAY) ×3 IMPLANT
TROCAR XCEL 12X100 BLDLESS (ENDOMECHANICALS) ×6 IMPLANT
WATER STERILE IRR 1000ML UROMA (IV SOLUTION) IMPLANT
WATER STERILE IRR 1500ML POUR (IV SOLUTION) IMPLANT

## 2016-01-01 NOTE — Discharge Instructions (Signed)
Activity:  You are encouraged to ambulate frequently (about every hour during waking hours) to help prevent blood clots from forming in your legs or lungs.  However, you should not engage in any heavy lifting (> 10-15 lbs), strenuous activity, or straining.  Diet: You should advance your diet as instructed by your physician.  It will be normal to have some bloating, nausea, and abdominal discomfort intermittently.  Prescriptions:  You will be provided a prescription for pain medication to take as needed.  If your pain is not severe enough to require the prescription pain medication, you may take extra strength Tylenol instead which will have less side effects.  You should also take a prescribed stool softener to avoid straining with bowel movements as the prescription pain medication may constipate you.  Incisions: You may remove your dressing bandages 48 hours after surgery if not removed in the hospital.  You will either have some small staples or special tissue glue at each of the incision sites. Once the bandages are removed (if present), the incisions may stay open to air.  You may start showering (but not soaking or bathing in water) the 2nd day after surgery and the incisions simply need to be patted dry after the shower.  No additional care is needed.  What to call us about: You should call the office ([phone]) if you develop fever > 101 or develop persistent vomiting. Activity:  You are encouraged to ambulate frequently (about every hour during waking hours) to help prevent blood clots from forming in your legs or lungs.  However, you should not engage in any heavy lifting (> 10-15 lbs), strenuous activity, or straining.    Foley Catheter Care A soft, flexible tube (Foley catheter) may have been placed in your bladder to drain urine and fluid. Follow these instructions: Taking Care of the Catheter Keep the area where the catheter leaves your body clean.  Attach the catheter to the leg so  there is no tension on the catheter.  Keep the drainage bag below the level of the bladder, but keep it OFF the floor.  Do not take long soaking baths. Your caregiver will give instructions about showering.  Wash your hands before touching ANYTHING related to the catheter or bag.  Using mild soap and warm water on a washcloth:  Clean the area closest to the catheter insertion site using a circular motion around the catheter.  Clean the catheter itself by wiping AWAY from the insertion site for several inches down the tube.  NEVER wipe upward as this could sweep bacteria up into the urethra (tube in your body that normally drains the bladder) and cause infection.  Place a small amount of sterile lubricant at the tip of the penis where the catheter is entering.  Taking Care of the Drainage Bags Two drainage bags may be taken home: a large overnight drainage bag, and a smaller leg bag which fits underneath clothing.  It is okay to wear the overnight bag at any time, but NEVER wear the smaller leg bag at night.  Keep the drainage bag well below the level of your bladder. This prevents backflow of urine into the bladder and allows the urine to drain freely.  Anchor the tubing to your leg to prevent pulling or tension on the catheter. Use tape or a leg strap provided by the hospital.  Empty the drainage bag when it is 1/2 to 3/4 full. Wash your hands before and after touching the bag.  Periodically   check the tubing for kinks to make sure there is no pressure on the tubing which could restrict the flow of urine.  Changing the Drainage Bags Cleanse both ends of the clean bag with alcohol before changing.  Pinch off the rubber catheter to avoid urine spillage during the disconnection.  Disconnect the dirty bag and connect the clean one.  Empty the dirty bag carefully to avoid a urine spill.  Attach the new bag to the leg with tape or a leg strap.  Cleaning the Drainage Bags Whenever a drainage bag is  disconnected, it must be cleaned quickly so it is ready for the next use.  Wash the bag in warm, soapy water.  Rinse the bag thoroughly with warm water.  Soak the bag for 30 minutes in a solution of white vinegar and water (1 cup vinegar to 1 quart warm water).  Rinse with warm water.  SEEK MEDICAL CARE IF:  You have chills or night sweats.  You are leaking around your catheter or have problems with your catheter. It is not uncommon to have sporadic leakage around your catheter as a result of bladder spasms. If the leakage stops, there is not much need for concern. If you are uncertain, call your caregiver.  You develop side effects that you think are coming from your medicines.  SEEK IMMEDIATE MEDICAL CARE IF:  You are suddenly unable to urinate. Check to see if there are any kinks in the drainage tubing that may cause this. If you cannot find any kinks, call your caregiver immediately. This is an emergency.  You develop shortness of breath or chest pains.  Bleeding persists or clots develop in your urine.  You have a fever.  You develop pain in your back or over your lower belly (abdomen).  You develop pain or swelling in your legs.  Any problems you are having get worse rather than better.  MAKE SURE YOU:  Understand these instructions.  Will watch your condition.  Will get help right away if you are not doing well or get worse.   

## 2016-01-01 NOTE — Anesthesia Procedure Notes (Addendum)
Procedure Name: Intubation Date/Time: 01/01/2016 7:34 AM Performed by: Ofilia Neas Pre-anesthesia Checklist: Patient identified, Emergency Drugs available, Suction available, Patient being monitored and Timeout performed Patient Re-evaluated:Patient Re-evaluated prior to inductionOxygen Delivery Method: Circle system utilized Preoxygenation: Pre-oxygenation with 100% oxygen Intubation Type: IV induction Ventilation: Mask ventilation without difficulty Laryngoscope Size: Miller and 2 Grade View: Grade I Tube type: Oral Tube size: 7.5 mm Number of attempts: 1 Airway Equipment and Method: Stylet Placement Confirmation: ETT inserted through vocal cords under direct vision,  positive ETCO2 and breath sounds checked- equal and bilateral Secured at: 22 cm Tube secured with: Tape Dental Injury: Teeth and Oropharynx as per pre-operative assessment

## 2016-01-01 NOTE — Transfer of Care (Signed)
Immediate Anesthesia Transfer of Care Note  Patient: Joan Ramirez  Procedure(s) Performed: Procedure(s): XI ROBOT ASSITED LAPAROSCOPIC LEFT NEPHROURETERECTOMY (Left) CYSTOSCOPY FLEXIBLE (N/A)  Patient Location: PACU  Anesthesia Type:General  Level of Consciousness: awake, patient cooperative, lethargic and responds to stimulation  Airway & Oxygen Therapy: Patient Spontanous Breathing and Patient connected to face mask oxygen  Post-op Assessment: Report given to RN, Post -op Vital signs reviewed and stable and Patient moving all extremities  Post vital signs: Reviewed and stable  Last Vitals:  Filed Vitals:   01/01/16 1405  Pulse: 79    Last Pain: There were no vitals filed for this visit.    Patients Stated Pain Goal: 4 ([id number])  Complications: No apparent anesthesia complications

## 2016-01-01 NOTE — Anesthesia Preprocedure Evaluation (Addendum)
Anesthesia Evaluation  Patient identified by MRN, date of birth, ID band Patient awake    Reviewed: Allergy & Precautions, NPO status , Patient's Chart, lab work & pertinent test results  History of Anesthesia Complications (+) PONV and history of anesthetic complications  Airway Mallampati: III  TM Distance: >3 FB Neck ROM: Full    Dental  (+) Teeth Intact, Dental Advisory Given   Pulmonary Current Smoker,     + decreased breath sounds      Cardiovascular negative cardio ROS   Rhythm:Regular Rate:Normal     Neuro/Psych negative neurological ROS  negative psych ROS   GI/Hepatic Neg liver ROS, GERD  Medicated,  Endo/Other  diabetes, Type 2, Oral Hypoglycemic Agents  Renal/GU negative Renal ROS  negative genitourinary   Musculoskeletal negative musculoskeletal ROS (+)   Abdominal Normal abdominal exam  (+)   Peds negative pediatric ROS (+)  Hematology negative hematology ROS (+)   Anesthesia Other Findings   Reproductive/Obstetrics negative OB ROS                            Lab Results  Component Value Date   WBC 12.6* 12/25/2015   HGB 14.8 12/25/2015   HCT 44.6 12/25/2015   MCV 94.9 12/25/2015   PLT 369 12/25/2015   Lab Results  Component Value Date   CREATININE 0.71 12/25/2015   BUN 18 12/25/2015   NA 136 12/25/2015   K 4.0 12/25/2015   CL 103 12/25/2015   CO2 23 12/25/2015   No results found for: INR, PROTIME  10/2015 EKG: normal sinus rhythm.   Anesthesia Physical Anesthesia Plan  ASA: II  Anesthesia Plan: General   Post-op Pain Management:    Induction: Intravenous  Airway Management Planned: Oral ETT  Additional Equipment:   Intra-op Plan:   Post-operative Plan: Extubation in OR  Informed Consent: I have reviewed the patients History and Physical, chart, labs and discussed the procedure including the risks, benefits and alternatives for the proposed  anesthesia with the patient or authorized representative who has indicated his/her understanding and acceptance.   Dental advisory given  Plan Discussed with: CRNA  Anesthesia Plan Comments:         Anesthesia Quick Evaluation

## 2016-01-01 NOTE — Op Note (Signed)
Preoperative diagnosis:  1. 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 Same   Procedure: 1. Left Robotic-assisted laparoscopic nephroureterectomy 2. Cystoscopy  Surgeon: Ardis Hughs, MD First Asst.: Dr. Virginia Crews, MD  Anesthesia: General  Complications: None  Intraoperative findings:  There was a desmoplastic reactive tissue around the proximal ureter consistent with the patient's history of transitional cell carcinoma. The adrenal gland was not spared during this procedure given that there was an adenoma noted on CT scan. The large bladder cuff was taken with the specimen. The distal curl of the stent was incidentally cut and remained indwelling in the bladder, and this necessitated removal by cystoscopy at the end of the case.  EBL: 100 mL's  Specimens: Left kidney and ureter with bladder cuff  Indication: Jazlin Louch is a 55 y.o. patient with history of low-grade upper tract left-sided transitional cell carcinoma.  After reviewing the management options for treatment, he elected to proceed with the above surgical procedure(s). We have discussed the potential benefits and risks of the procedure, side effects of the proposed treatment, the likelihood of the patient achieving the goals of the procedure, and any potential problems that might occur during the procedure or recuperation. Informed consent has been obtained.  Description of procedure:  The patient was taken to the operating room and general anesthesia was induced.  She was then placed in the left lateral decubitus position with the bed flexed slightly. A Foley catheter was inserted. The patient was secured to the table with a beanbag as well as 4 x 4 tape and all bony prominences were taped. A timeout was then performed.  We then made an 8 mm incision superior and lateral to the left umbilicus and cut down to the fascia where I was able to pierce the peritoneum and  gently pass an 8 mm robotic trocar into the abdomen. The abdomen was then insufflated and the robotic camera was then passed through the trocar. There was omental adhesions to the anterior abdominal wall in the left upper quadrant which were not in the way of the port placement. We then placed a second robotic port 8 cm superior and more lateral just at the subcostal margin in the left upper quadrant. A third robotic arm was then placed below the camera port and more medial 8 cm from the camera. A fourth arm was then placed further inferior and nearly midline making a straight line angled to the patient's left shoulder. A 12 mm port was then placed in the supraumbilical midline position. The robot was then docked.  The dissection began by removing the omental adhesions off the anterior abdominal wall. We then proceeded to mobilize the descending and transverse colon off the patient's kidney medially. This was carried out down to the proximal ureter. Once once the colon was then off of the kidney we entered into the retroperitoneal space the lower pole and isolated the ureter. This was noted to be adherent to the so as fascia and with blunt and sharp dissection in combination I was able to get the psoas fascia free from the proximal ureter. I then placed a fourth robotic arm underneath the ureter lifting the kidney up and medially. We then continued our dissection down to the hilum. One renal artery was noted in between the bifurcation of the renal vein. Using a endovascular stapler the artery was taken completely with one load. We then dissected out the vein and and took the vein  proximal to the bifurcation. We then freed the kidney from the medial and upper pole attachments ensuring that the adrenal gland was included in our specimen. We then freed the posterior and lateral attachments of the kidney using a combination of blunt and sharp dissection. Working our way to the lower pole the kidney was ultimately  freed and are attention was focused then on the ureter. The ureter was taken down to the level of the iliac vessels were crossed down into the pelvis. The gonadal vein was taken with 2 clips at this point. The robot was then undocked and retargeted to the bladder.  The distal ureter was then dissected free from the surrounding tissues ensuring preservation of the superior vesical artery. Once at the ureteral hiatus/UVJ a 3-0 V-lock suture was placed laterally and used to retract the bladder laterally as the distal aspect of the ureter and ureteral orifice was dissected out. The bladder was then filled with 300 mL of fluid and ultimately the bladder was entered at the left UVJ. The stent was cut at this point in the distal aspect of the center curl then migrated to the contralateral side of the bladder which I was unable to retrieve. The bladder cuff was then excised. Using the preplaced 3-0 V lock suture the bladder was then closed in a running fashion. A second layer closure was performed using a 2-0 V-loc. The bladder was then filled up again ensuring that there was no leak. The specimen was then placed in a large specimen bag. A drain was then placed through the right lower quadrant position and the robotic port removed.  The robot was then undocked and the ports removed. A supraumbilical midline incision was then made down to the fascia and the specimen was extracted through this area. The rectus fascia was then reapproximated with a 1-0 PDS suture. 3-0 Vicryl was used to close the deep dermal layers. Ex parole was then injected into all the incisions. The incisions were then subsequently reapproximated with 4-0 Monocryl in a subcuticular fashion. A drain stitch was placed in the drain port and secured to the drain. Dermabond was then applied to the incisions.   The patient was subsequently repositioned to the supine position and frog leg. Using a flexible cystoscope by gently pass the scope into the  patient's bladder where the remaining curl of the stent was easily identified and subsequently removed using flexible graspers. A 16 French Foley catheter was then replaced into the patient's bladder. The patient was subsequently extubated and returned to the PACU in stable condition.   Ardis Hughs, M.D.

## 2016-01-01 NOTE — H&P (Signed)
History of Present Illness  Joan Ramirez is a 55 year old female to discuss surgery. 10/30/15 - Transitional cell carcinoma: She noticed gross hematuria that was not associated with any flank pain or change in her urinary pattern. She has a 37-pack-year smoking history. Her urine culture was found to be negative. She told me that she actually had experienced hematuria on 2 occasions about once a year over the past 2 years. Urine cytology: Negative Creatinine - 0.71  CT scan: Large filling defect in the proximal left ureter/UPJ region with proximal hydronephrosis and some blunting of the calyces indicating this has been present for some time. Cystoscopy, no abnormality noted within the bladder. Left ureteroscopy with biopsy of the mass and stent placement on 10/30/15. Pathology: Transitional cell carcinoma Grade: G1  Bilateral adrenal adenomas: These were noted on her CT scan in 4/17. She had no stigmata or laboratory abnormalities to suggest functionality.  Bilateral renal calculi: CT scan 4/17 revealed bilateral lower pole renal calculi that were small.  Interval history: The patient presents today to discuss removal of her left kidney and ureter. She was diagnosed with large volume low grade TCC of her left renal pelvis. She has gone over the diagnosis with Dr. Karsten Ro.  The patient lives alone with her dog. She is a current smoker. She has relatively poor exercise tolerance. She denies a history of heart attack or stroke. She has a poorly controlled diabetic although recently it has improved. She is able to walk slowly on flat ground for approximately 1/2 mile. She has a difficult time performing steps.   Past Medical History Problems  1. History of asthma (Z87.09) 2. History of diabetes mellitus (Z86.39) Surgical History Problems  1. History of Cystoscopy With Insertion Of Ureteral Stent Left 2. History of Cystoscopy With Ureteroscopy For Biopsy Left 3. History of No Surgical  Problems Current Meds 1. GlyBURIDE-MetFORMIN 2.5-500 MG Oral Tablet; Therapy: (Recorded:04May2017) to Recorded Allergies Medication  1. No Known Drug Allergies  Family History Problems  1. Family history of Death of family member : Mother, Father  Mother - 69/ lung cancer Father - 76/ lung cancer 2. Family history of diabetes mellitus (Z83.3) : Father, Brother 3. Family history of lung cancer (Z80.1) : Mother, Father 4. Family history of lymphoma (Z80.2) : Mother 5. Family history of skin cancer (Z80.8) : Mother  Social History Problems   Denied: History of Alcohol use  Caffeine use (F15.90)  5-6 per day  Current every day smoker (F17.200)  smokes 2 packs per day since 1977  Occupation  computer programmer  Single  [place] Signs [Data Includes: Last 1 Day]  Recorded: [id number] 11:30AM  Blood Pressure: 118 / 78 Heart Rate: 84  Physical Exam Constitutional: Well nourished and well developed . No acute distress.  ENT:. The ears and nose are normal in appearance.  Neck: The appearance of the neck is normal and no neck mass is present.  Pulmonary: No respiratory distress and normal respiratory rhythm and effort.  Cardiovascular: Heart rate and rhythm are normal . No peripheral edema.  Abdomen: The abdomen is soft and nontender. No masses are palpated. No CVA tenderness. No hernias are palpable. No hepatosplenomegaly noted.  Skin: Normal skin turgor, no visible rash and no visible skin lesions.  Neuro/Psych:. Mood and affect are appropriate.   Results/Data No urinalysis was obtained  Assessment  Assessed  1. 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  Plan Transitional cell carcinoma of ureter  1. Follow-up Schedule Surgery Office Follow-up Status: Hold For - Appointment  Requested for: [id number]  Discussion/Summary The patient has been completely evaluated  and advised on her low-grade high volume transitional cell carcinoma of the left renal pelvis. Today, we discussed surgical therapy. In particular, we discussed performing a left sided robotic-assisted laparoscopic nephroureterectomy. I discussed the surgery with this patient in great detail including the position during the operation, the placement of our ports, and the basic steps of the operation. We discussed the risk of the operation including damage to the great vessels and nerves, damage to the surrounding structures including the spleen, pancreas, and intestines. We also briefly discussed positional injuries. I informed the patient that after surgery she would have a small drain in her abdomen as well as a Foley catheter. I explained that she will need to keep the Foley catheter for approximately 5-7 days postoperatively. I then explained the expected hospital course remained between 1 and a nights. 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 I explained to her there is no heavy lifting greater than 10 pounds for 6 weeks. I encouraged the patient to work on smoking cessation between now and the time of operation. I also encouraged the patient to continue to work hard on getting her blood glucose levels down to normal level. Given the patient's comorbidities and sedentary lifestyle, recommended that she have a cardiac evaluation or at least clearance by the nurse practitioner that works within her office building.

## 2016-01-01 NOTE — Anesthesia Postprocedure Evaluation (Signed)
Anesthesia Post Note  Patient: Joan Ramirez  Procedure(s) Performed: Procedure(s) (LRB): XI ROBOT ASSITED LAPAROSCOPIC LEFT NEPHROURETERECTOMY (Left) CYSTOSCOPY FLEXIBLE (N/A)  Patient location during evaluation: PACU Anesthesia Type: General Level of consciousness: awake and alert Pain management: pain level controlled Vital Signs Assessment: post-procedure vital signs reviewed and stable Respiratory status: spontaneous breathing, nonlabored ventilation, respiratory function stable and patient connected to nasal cannula oxygen Cardiovascular status: blood pressure returned to baseline and stable Postop Assessment: no signs of nausea or vomiting Anesthetic complications: no    Last Vitals:  Filed Vitals:   01/01/16 1405 01/01/16 1415  BP: 164/69 145/74  Pulse: 79 79  Resp: 22 12    Last Pain: There were no vitals filed for this visit.               Zenaida Deed

## 2016-01-02 LAB — CBC
HEMATOCRIT: 38.9 % (ref 36.0–46.0)
Hemoglobin: 12.8 g/dL (ref 12.0–15.0)
MCH: 31.2 pg (ref 26.0–34.0)
MCHC: 32.9 g/dL (ref 30.0–36.0)
MCV: 94.9 fL (ref 78.0–100.0)
PLATELETS: 332 10*3/uL (ref 150–400)
RBC: 4.1 MIL/uL (ref 3.87–5.11)
RDW: 12.8 % (ref 11.5–15.5)
WBC: 21.2 10*3/uL — ABNORMAL HIGH (ref 4.0–10.5)

## 2016-01-02 LAB — BASIC METABOLIC PANEL
Anion gap: 5 (ref 5–15)
BUN: 13 mg/dL (ref 6–20)
CALCIUM: 7.9 mg/dL — AB (ref 8.9–10.3)
CO2: 25 mmol/L (ref 22–32)
CREATININE: 0.68 mg/dL (ref 0.44–1.00)
Chloride: 105 mmol/L (ref 101–111)
GFR calc Af Amer: >60 mL/min (ref >60)
GLUCOSE: 97 mg/dL (ref 65–99)
Potassium: 3.9 mmol/L (ref 3.5–5.1)
Sodium: 135 mmol/L (ref 135–145)

## 2016-01-02 LAB — GLUCOSE, CAPILLARY
GLUCOSE-CAPILLARY: 179 mg/dL — AB (ref 65–99)
Glucose-Capillary: 102 mg/dL — ABNORMAL HIGH (ref 65–99)
Glucose-Capillary: 100 mg/dL — ABNORMAL HIGH (ref 65–99)
Glucose-Capillary: 111 mg/dL — ABNORMAL HIGH (ref 65–99)
Glucose-Capillary: 149 mg/dL — ABNORMAL HIGH (ref 65–99)

## 2016-01-02 LAB — URINE CULTURE: CULTURE: NO GROWTH

## 2016-01-02 LAB — CREATININE, FLUID (PLEURAL, PERITONEAL, JP DRAINAGE): Creat, Fluid: 0.8 mg/dL

## 2016-01-02 NOTE — Progress Notes (Signed)
Patient ID: Joan Ramirez, female   DOB: 04/02/1961, 55 y.o.   MRN: [id number] 1 Day Post-Op  Subjective: POD #1 from left nephroureterectomy for urothelial carcinoma.    She has had some nausea with coffee this morning but not much pain.   Her JP output is moderate .   The  Urine is clear in the foley.    ROS:  Review of Systems  Constitutional: Negative for fever and chills.  Respiratory: Positive for cough.   Cardiovascular: Negative for leg swelling.  Gastrointestinal: Positive for nausea. Negative for abdominal pain.  Musculoskeletal: Negative.     Anti-infectives: Anti-infectives    Start     Dose/Rate Route Frequency Ordered Stop   01/01/16 2100  ciprofloxacin (CIPRO) IVPB 400 mg     400 mg 200 mL/hr over 60 Minutes Intravenous Every 12 hours 01/01/16 1548 01/03/16 0859   01/01/16 0553  ceFAZolin (ANCEF) IVPB 2g/100 mL premix     2 g 200 mL/hr over 30 Minutes Intravenous 30 min pre-op 01/01/16 0553 01/01/16 1125   01/01/16 0553  ciprofloxacin (CIPRO) IVPB 400 mg     400 mg 200 mL/hr over 60 Minutes Intravenous 60 min pre-op 01/01/16 0553 01/01/16 0735      Current Facility-Administered Medications  Medication Dose Route Frequency Provider Last Rate Last Dose  . acetaminophen (OFIRMEV) IV 1,000 mg  1,000 mg Intravenous Q6H Lolita Rieger, MD   1,000 mg at 01/02/16 0523  . ciprofloxacin (CIPRO) IVPB 400 mg  400 mg Intravenous Q12H Lolita Rieger, MD   400 mg at 01/02/16 0913  . docusate sodium (COLACE) capsule 100 mg  100 mg Oral BID Lolita Rieger, MD   100 mg at 01/02/16 0913  . HYDROmorphone (DILAUDID) injection 0.5 mg  0.5 mg Intravenous Q3H PRN Lolita Rieger, MD      . insulin aspart (novoLOG) injection 0-24 Units  0-24 Units Subcutaneous Q4H Lolita Rieger, MD   4 Units at 01/01/16 2344  . lactated ringers infusion   Intravenous Continuous Lolita Rieger, MD 100 mL/hr at 01/02/16 [phone]    . ondansetron (ZOFRAN-ODT) disintegrating tablet 4 mg  4 mg Oral Q6H PRN Lolita Rieger, MD       Or  . ondansetron [facility]) injection 4 mg  4 mg Intravenous Q6H PRN Lolita Rieger, MD   4 mg at 01/02/16 0914  . oxyCODONE (Oxy IR/ROXICODONE) immediate release tablet 5-10 mg  5-10 mg Oral Q4H PRN Lolita Rieger, MD   10 mg at 01/02/16 0926  . senna (SENOKOT) tablet 8.6 mg  1 tablet Oral BID Lolita Rieger, MD   8.6 mg at 01/02/16 0913     Objective: Vital signs in last 24 hours: Temp:  [97.7 F (36.5 C)-98.8 F (37.1 C)] 98.8 F (37.1 C) (07/08 0856) Pulse Rate:  [70-81] 80 (07/08 0856) Resp:  [12-23] 20 (07/08 0856) BP: (131-164)/(51-92) 133/51 mmHg (07/08 0856) SpO2:  [91 %-100 %] 95 % (07/08 0856) Weight:  [95.8 kg (211 lb 3.2 oz)] 95.8 kg (211 lb 3.2 oz) (07/07 1700)  Intake/Output from previous day: 07/07 0701 - 07/08 0700 In: 4810 [I.V.:4310; IV Piggyback:500] Out: 1760 [Urine:1525; Drains:135; Blood:100] Intake/Output this shift:     Physical Exam  Constitutional: She is oriented to person, place, and time and well-developed, well-nourished, and in no distress.  Cardiovascular: Normal rate, regular rhythm and normal heart sounds.   Pulmonary/Chest: Effort normal. No respiratory distress. She has wheezes (right upper chest  and she has a pruductive sounding cough).  Abdominal: Soft. Bowel sounds are normal. She exhibits no distension. There is no tenderness.  Incision is intact with a JP in the LLQ that is draining serosanguinous fluid.   Musculoskeletal: Normal range of motion. She exhibits no edema or tenderness.  Neurological: She is alert and oriented to person, place, and time.  Skin: Skin is warm and dry.    Lab Results:   Recent Labs  01/01/16 1421 01/02/16 0436  WBC 25.0* 21.2*  HGB 13.6 12.8  HCT 40.1 38.9  PLT 320 332   BMET  Recent Labs  01/01/16 1421 01/02/16 0436  NA 134* 135  K 4.7 3.9  CL 104 105  CO2 24 25  GLUCOSE 229* 97  BUN 16 13  CREATININE 0.90 0.68  CALCIUM 7.9* 7.9*   PT/INR No results for input(s): LABPROT, INR in the last 72  hours. ABG No results for input(s): PHART, HCO3 in the last 72 hours.  Invalid input(s): PCO2, PO2  Studies/Results: No results found.   Assessment and Plan: She is having some nausea and moderate drain output and just doesn't appear ready for discharge.  I will check the JP fluid for Cr and keep her at least until tomorrow to make sure she is tolerating po's prior to discharge.         LOS: 1 day    Joan Ramirez 01/02/2016 [phone]

## 2016-01-03 LAB — GLUCOSE, CAPILLARY
GLUCOSE-CAPILLARY: 129 mg/dL — AB (ref 65–99)
Glucose-Capillary: 112 mg/dL — ABNORMAL HIGH (ref 65–99)
Glucose-Capillary: 148 mg/dL — ABNORMAL HIGH (ref 65–99)

## 2016-01-03 LAB — BASIC METABOLIC PANEL
ANION GAP: 5 (ref 5–15)
BUN: 8 mg/dL (ref 6–20)
CALCIUM: 7.7 mg/dL — AB (ref 8.9–10.3)
CHLORIDE: 101 mmol/L (ref 101–111)
CO2: 27 mmol/L (ref 22–32)
Creatinine, Ser: 0.78 mg/dL (ref 0.44–1.00)
GFR calc non Af Amer: >60 mL/min (ref >60)
GLUCOSE: 116 mg/dL — AB (ref 65–99)
Potassium: 3.8 mmol/L (ref 3.5–5.1)
Sodium: 133 mmol/L — ABNORMAL LOW (ref 135–145)

## 2016-01-03 LAB — CBC
HEMATOCRIT: 33.8 % — AB (ref 36.0–46.0)
HEMOGLOBIN: 11.8 g/dL — AB (ref 12.0–15.0)
MCH: 32.8 pg (ref 26.0–34.0)
MCHC: 34.9 g/dL (ref 30.0–36.0)
MCV: 93.9 fL (ref 78.0–100.0)
Platelets: 250 10*3/uL (ref 150–400)
RBC: 3.6 MIL/uL — ABNORMAL LOW (ref 3.87–5.11)
RDW: 12.5 % (ref 11.5–15.5)
WBC: 16.9 10*3/uL — AB (ref 4.0–10.5)

## 2016-01-03 NOTE — Discharge Summary (Signed)
Physician Discharge Summary  Patient ID: Joan Ramirez MRN: [id number] DOB/AGE: 10-25-1960 55 y.o.  Admit date: 01/01/2016 Discharge date: 01/03/2016  Admission Diagnoses:  Urothelial carcinoma of kidney [facility])  Discharge Diagnoses:  Principal Problem:   Urothelial carcinoma of kidney ([place]) Active Problems:   Cancer of renal pelvis [facility])   Past Medical History  Diagnosis Date  . Renal mass, left     PELVIC AREA  . Type 2 diabetes mellitus ([place])   . History of concussion     2015  no LOC  and no residual  . GERD (gastroesophageal reflux disease)   . Psoriasis   . Complication of anesthesia   . PONV (postoperative nausea and vomiting)   . Pneumonia   . Bronchitis   . Cancer ([place])     upper urinary tract transitional cell carcinoma    Surgeries: Procedure(s): XI ROBOT ASSITED LAPAROSCOPIC LEFT NEPHROURETERECTOMY CYSTOSCOPY FLEXIBLE on 01/01/2016   Consultants (if any):    Discharged Condition: Improved  Hospital Course: Joan Ramirez is an 55 y.o. female who was admitted 01/01/2016 with a diagnosis of Urothelial carcinoma of kidney ([place]) and went to the operating room on 01/01/2016 and underwent the above named procedures.  She had some nausea on POD1 but that has resolved as has her cough.  She is tolerating clear liquids.  Her JP drainage has declined and the Cr is 0.8 in the fluid so the drain was removed.   She was discharged home with a leg bag.   She was given perioperative antibiotics:      Anti-infectives    Start     Dose/Rate Route Frequency Ordered Stop   01/01/16 2100  ciprofloxacin (CIPRO) IVPB 400 mg     400 mg 200 mL/hr over 60 Minutes Intravenous Every 12 hours 01/01/16 1548 01/02/16 2127   01/01/16 0553  ceFAZolin (ANCEF) IVPB 2g/100 mL premix     2 g 200 mL/hr over 30 Minutes Intravenous 30 min pre-op 01/01/16 0553 01/01/16 1125   01/01/16 0553  ciprofloxacin (CIPRO) IVPB 400 mg     400 mg 200 mL/hr over 60 Minutes Intravenous 60 min pre-op 01/01/16 0553  01/01/16 0735    .  She was given sequential compression devices for DVT prophylaxis.  She benefited maximally from the hospital stay and there were no complications.    Recent vital signs:  Filed Vitals:   01/02/16 2004 01/03/16 0410  BP: 143/55 139/63  Pulse: 86 93  Temp: 99.9 F (37.7 C) 99 F (37.2 C)  Resp: 16 18    Recent laboratory studies:  Lab Results  Component Value Date   HGB 11.8* 01/03/2016   HGB 12.8 01/02/2016   HGB 13.6 01/01/2016   Lab Results  Component Value Date   WBC 16.9* 01/03/2016   PLT 250 01/03/2016   No results found for: INR Lab Results  Component Value Date   NA 133* 01/03/2016   K 3.8 01/03/2016   CL 101 01/03/2016   CO2 27 01/03/2016   BUN 8 01/03/2016   CREATININE 0.78 01/03/2016   GLUCOSE 116* 01/03/2016    Discharge Medications:     Medication List    STOP taking these medications        aspirin-acetaminophen-caffeine 250-250-65 MG tablet  Commonly known as:  EXCEDRIN MIGRAINE      TAKE these medications        calcium carbonate 500 MG chewable tablet  Commonly known as:  TUMS - dosed in mg elemental calcium  Chew  1 tablet by mouth as needed for indigestion or heartburn.     glimepiride 4 MG tablet  Commonly known as:  AMARYL  Take 1 tablet by mouth daily.     HYDROcodone-acetaminophen 10-325 MG tablet  Commonly known as:  NORCO  Take 1-2 tablets by mouth every 4 (four) hours as needed for moderate pain. Maximum dose per 24 hours - 8 pills     losartan 25 MG tablet  Commonly known as:  COZAAR  Take 1 tablet by mouth 2 (two) times daily as needed.     metFORMIN 500 MG tablet  Commonly known as:  GLUCOPHAGE  Take 1 tablet by mouth 2 (two) times daily.     NYQUIL PO  Take 1 capsule by mouth daily as needed (sleep).     oxyCODONE-acetaminophen 5-325 MG tablet  Commonly known as:  ROXICET  Take 1-2 tablets by mouth every 4 (four) hours as needed for severe pain.     phenazopyridine 200 MG tablet  Commonly  known as:  PYRIDIUM  Take 1 tablet (200 mg total) by mouth 3 (three) times daily as needed for pain.        Diagnostic Studies: No results found.  Disposition: 01-Home or Self Care  Discharge Instructions    Care order/instruction    Complete by:  As directed   Remove JP drain and replace dressing over drain site.     Discharge instructions    Complete by:  As directed   Activity:  You are encouraged to ambulate frequently (about every hour during waking hours) to help prevent blood clots from forming in your legs or lungs.  However, you should not engage in any heavy lifting (> 10-15 lbs), strenuous activity, or straining. Diet: You should advance your diet as instructed by your physician.  It will be normal to have some bloating, nausea, and abdominal discomfort intermittently. Prescriptions:  You will be provided a prescription for pain medication to take as needed.  If your pain is not severe enough to require the prescription pain medication, you may take extra strength Tylenol instead which will have less side effects.  You should also take a prescribed stool softener to avoid straining with bowel movements as the prescription pain medication may constipate you. Incisions: You may remove your dressing bandages 48 hours after surgery if not removed in the hospital.  You will either have some small staples or special tissue glue at each of the incision sites. Once the bandages are removed (if present), the incisions may stay open to air.  You may start showering (but not soaking or bathing in water) the 2nd day after surgery and the incisions simply need to be patted dry after the shower.  No additional care is needed. What to call us about: You should call the office [phone]) if you develop fever > 101 or develop persistent vomiting.     Discontinue IV    Complete by:  As directed      Increase activity slowly    Complete by:  As directed            Follow-up Information     Follow up with Ardis Hughs, MD On 01/11/2016.   Specialty:  Urology   Why:  8:15am   Contact information:   [place] [address] [phone]        Signed: Malka So 01/03/2016, 7:33 AM

## 2018-05-31 ENCOUNTER — Other Ambulatory Visit: Payer: Self-pay | Admitting: Urology

## 2018-06-21 NOTE — Patient Instructions (Addendum)
Joan Ramirez  06/21/2018   Your procedure is scheduled on: 06-28-18  Report to [facility] Main  Entrance  Report to admitting at 1030 AM    Call this number if you have problems the morning of surgery [phone]   Remember: Do not eat food or drink liquids :After Midnight. BRUSH YOUR TEETH MORNING OF SURGERY AND RINSE YOUR MOUTH OUT, NO CHEWING GUM CANDY OR MINTS.  How to Manage Your Diabetes Before and After Surgery  Why is it important to control my blood sugar before and after surgery? . Improving blood sugar levels before and after surgery helps healing and can limit problems. . A way of improving blood sugar control is eating a healthy diet by: o  Eating less sugar and carbohydrates o  Increasing activity/exercise o  Talking with your doctor about reaching your blood sugar goals . High blood sugars (greater than 180 mg/dL) can raise your risk of infections and slow your recovery, so you will need to focus on controlling your diabetes during the weeks before surgery. . Make sure that the doctor who takes care of your diabetes knows about your planned surgery including the date and location.  How do I manage my blood sugar before surgery? . Check your blood sugar at least 4 times a day, starting 2 days before surgery, to make sure that the level is not too high or low. o Check your blood sugar the morning of your surgery when you wake up and every 2 hours until you get to the Short Stay unit. . If your blood sugar is less than 70 mg/dL, you will need to treat for low blood sugar: o Do not take insulin. o Treat a low blood sugar (less than 70 mg/dL) with  cup of clear juice (cranberry or apple), 4 glucose tablets, OR glucose gel. o Recheck blood sugar in 15 minutes after treatment (to make sure it is greater than 70 mg/dL). If your blood sugar is not greater than 70 mg/dL on recheck, call [phone] for further instructions. . Report your blood sugar to the  short stay nurse when you get to Short Stay.  . If you are admitted to the hospital after surgery: o Your blood sugar will be checked by the staff and you will probably be given insulin after surgery (instead of oral diabetes medicines) to make sure you have good blood sugar levels. o The goal for blood sugar control after surgery is 80-180 mg/dL.   WHAT DO I DO ABOUT MY DIABETES MEDICATION?  Marland Kitchen Do not take oral diabetes medicines (pills) the morning of surgery.  . THE DAY BEFORE SURGERY TAKE YOUR GLIMPERIDE AND METFORMIN AS USUSL.       THE MORNING OF SURGERY DO NOT TAKE YOUR METFORMIN OR [place]  Patient Signature:  Date:   Nurse Signature:  Date:   Reviewed and Endorsed by [facility] Patient Education Committee, August 2015   Take these medicines the morning of surgery with A SIP OF WATER: BUPROPION (WELLBUTRIN), VARENCLIN (CHANTEX) DO NOT TAKE ANY DIABETIC MEDICATIONS DAY OF YOUR SURGERY                               You may not have any metal on your body including hair pins and              piercings  Do not wear jewelry, make-up, lotions, powders or perfumes, deodorant             Do not wear nail polish.  Do not shave  48 hours prior to surgery.              Men may shave face and neck.   Do not bring valuables to the hospital. [place].  Contacts, dentures or bridgework may not be worn into surgery.  Leave suitcase in the car. After surgery it may be brought to your room.     Patients discharged the day of surgery will not be allowed to drive home.  Name and phone number of your driver: roberta friend driver  Special Instructions: N/A              Please read over the following fact sheets you were given: _____________________________________________________________________   [facility] - Preparing for Surgery Before surgery, you can play an important role.  Because skin is not sterile, your skin needs to be  as free of germs as possible.  You can reduce the number of germs on your skin by washing with CHG (chlorahexidine gluconate) soap before surgery.  CHG is an antiseptic cleaner which kills germs and bonds with the skin to continue killing germs even after washing. Please DO NOT use if you have an allergy to CHG or antibacterial soaps.  If your skin becomes reddened/irritated stop using the CHG and inform your nurse when you arrive at Short Stay. Do not shave (including legs and underarms) for at least 48 hours prior to the first CHG shower.  You may shave your face/neck. Please follow these instructions carefully:  1.  Shower with CHG Soap the night before surgery and the  morning of Surgery.  2.  If you choose to wash your hair, wash your hair first as usual with your  normal  shampoo.  3.  After you shampoo, rinse your hair and body thoroughly to remove the  shampoo.                           4.  Use CHG as you would any other liquid soap.  You can apply chg directly  to the skin and wash                       Gently with a scrungie or clean washcloth.  5.  Apply the CHG Soap to your body ONLY FROM THE NECK DOWN.   Do not use on face/ open                           Wound or open sores. Avoid contact with eyes, ears mouth and genitals (private parts).                       Wash face,  Genitals (private parts) with your normal soap.             6.  Wash thoroughly, paying special attention to the area where your surgery  will be performed.  7.  Thoroughly rinse your body with warm water from the neck down.  8.  DO NOT shower/wash with your normal soap after using and rinsing off  the CHG Soap.  9.  Pat yourself dry with a clean towel.            10.  Wear clean pajamas.            11.  Place clean sheets on your bed the night of your first shower and do not  sleep with pets. Day of Surgery : Do not apply any lotions/deodorants the morning of surgery.  Please wear clean clothes to the  hospital/surgery center.  FAILURE TO FOLLOW THESE INSTRUCTIONS MAY RESULT IN THE CANCELLATION OF YOUR SURGERY PATIENT SIGNATURE_________________________________  NURSE SIGNATURE__________________________________  ________________________________________________________________________

## 2018-06-22 ENCOUNTER — Encounter (HOSPITAL_COMMUNITY): Payer: Self-pay

## 2018-06-22 ENCOUNTER — Other Ambulatory Visit: Payer: Self-pay

## 2018-06-22 ENCOUNTER — Encounter (HOSPITAL_COMMUNITY)
Admission: RE | Admit: 2018-06-22 | Discharge: 2018-06-22 | Disposition: A | Discharge status: Home / Self Care | Payer: Commercial Managed Care - PPO | Source: Ambulatory Visit | Attending: Urology | Admitting: Urology

## 2018-06-22 DIAGNOSIS — I10 Essential (primary) hypertension: Secondary | ICD-10-CM | POA: Insufficient documentation

## 2018-06-22 DIAGNOSIS — Z01818 Encounter for other preprocedural examination: Secondary | ICD-10-CM | POA: Insufficient documentation

## 2018-06-22 DIAGNOSIS — K219 Gastro-esophageal reflux disease without esophagitis: Secondary | ICD-10-CM | POA: Diagnosis not present

## 2018-06-22 DIAGNOSIS — E119 Type 2 diabetes mellitus without complications: Secondary | ICD-10-CM | POA: Insufficient documentation

## 2018-06-22 DIAGNOSIS — Z7984 Long term (current) use of oral hypoglycemic drugs: Secondary | ICD-10-CM | POA: Diagnosis not present

## 2018-06-22 DIAGNOSIS — C679 Malignant neoplasm of bladder, unspecified: Secondary | ICD-10-CM | POA: Insufficient documentation

## 2018-06-22 DIAGNOSIS — Z79899 Other long term (current) drug therapy: Secondary | ICD-10-CM | POA: Insufficient documentation

## 2018-06-22 HISTORY — DX: Family history of other specified conditions: Z84.89

## 2018-06-22 HISTORY — DX: Essential (primary) hypertension: I10

## 2018-06-22 LAB — CBC
HCT: 42 % (ref 36.0–46.0)
HEMOGLOBIN: 13.4 g/dL (ref 12.0–15.0)
MCH: 31.4 pg (ref 26.0–34.0)
MCHC: 31.9 g/dL (ref 30.0–36.0)
MCV: 98.4 fL (ref 80.0–100.0)
Platelets: 428 10*3/uL — ABNORMAL HIGH (ref 150–400)
RBC: 4.27 MIL/uL (ref 3.87–5.11)
RDW: 12 % (ref 11.5–15.5)
WBC: 14.6 10*3/uL — ABNORMAL HIGH (ref 4.0–10.5)
nRBC: 0 % (ref 0.0–0.2)

## 2018-06-22 LAB — COMPREHENSIVE METABOLIC PANEL
ALT: 11 U/L (ref 0–44)
AST: 18 U/L (ref 15–41)
Albumin: 3.9 g/dL (ref 3.5–5.0)
Alkaline Phosphatase: 83 U/L (ref 38–126)
Anion gap: 9 (ref 5–15)
BUN: 30 mg/dL — ABNORMAL HIGH (ref 6–20)
CO2: 23 mmol/L (ref 22–32)
Calcium: 8.6 mg/dL — ABNORMAL LOW (ref 8.9–10.3)
Chloride: 105 mmol/L (ref 98–111)
Creatinine, Ser: 1.14 mg/dL — ABNORMAL HIGH (ref 0.44–1.00)
GFR calc Af Amer: >60 mL/min (ref >60)
GFR calc non Af Amer: 53 mL/min — ABNORMAL LOW (ref >60)
Glucose, Bld: 136 mg/dL — ABNORMAL HIGH (ref 70–99)
Potassium: 4.3 mmol/L (ref 3.5–5.1)
SODIUM: 137 mmol/L (ref 135–145)
Total Bilirubin: 0.5 mg/dL (ref 0.3–1.2)
Total Protein: 6.8 g/dL (ref 6.5–8.1)

## 2018-06-22 LAB — HEMOGLOBIN A1C
HEMOGLOBIN A1C: 6.3 % — AB (ref 4.8–5.6)
MEAN PLASMA GLUCOSE: 134.11 mg/dL

## 2018-06-22 LAB — GLUCOSE, CAPILLARY: Glucose-Capillary: 168 mg/dL — ABNORMAL HIGH (ref 70–99)

## 2018-06-23 LAB — URINE CULTURE: Culture: NO GROWTH

## 2018-06-25 NOTE — Progress Notes (Signed)
Anesthesia Chart Review:  Case:  [id number] Date/Time:  06/28/18 1215   Procedures:      TRANSURETHRAL RESECTION OF BLADDER TUMOR (TURBT) (N/A )     RIGHT URETEROSCOPY STENT PLACEMENT (Right )   Anesthesia type:  General   Pre-op diagnosis:  RECURRENT BLADDER CANCER   Location:  [facility] ROOM 03 / [facility]   Surgeon:  Ardis Hughs, MD      DISCUSSION: Patient is a 57 year old female scheduled for the above procedure.  History includes smoking, post-operative N/V, HTN, DM2, psoriasis, GERD, cancer of the renal pelvis/low grade papillary urothelial carcinoma (s/p left robotic assisted laparoscopic nephroureterectomy 01/01/16).  She had a mildly elevated WBC, but normal urine culture. Labs in Epic for surgeon to review and voice message left with his scheduler Pam. No symptoms reported at her RN PST visit. Based on available information, I would anticipate that she can proceed from an anesthesia standpoint.    VS: BP 139/76   Pulse 80   Temp 36.9 C (Oral)   Ht 5' 6.5" (1.689 m)   Wt 101.2 kg   SpO2 97%   BMI 35.45 kg/m    PROVIDERS: Larene Beach, MD is PCP. Last visit 04/12/18. See [facility] Care Everywhere. WBC 12.8 at that visit.   LABS: WBC 14.6 (prevously 12.8 at 04/12/18 PCP visit). Per PST RN, no symptoms reported. Urine culture showed no growth. BUN 30, Cr 1.14. I left a voice message for Pam at Dr. Carlton Adam office regarding mildly elevated WBC with normal urine culture and BUN/Cr results. If any follow-up labs desired from a surgeon standpoint then I've asked her to let me know.   (all labs ordered are listed, but only abnormal results are displayed)  Labs Reviewed  GLUCOSE, CAPILLARY - Abnormal; Notable for the following components:      Result Value   Glucose-Capillary 168 (*)    All other components within normal limits  HEMOGLOBIN A1C - Abnormal; Notable for the following components:   Hgb A1c MFr Bld 6.3 (*)    All other components within normal limits  CBC -  Abnormal; Notable for the following components:   WBC 14.6 (*)    Platelets 428 (*)    All other components within normal limits  COMPREHENSIVE METABOLIC PANEL - Abnormal; Notable for the following components:   Glucose, Bld 136 (*)    BUN 30 (*)    Creatinine, Ser 1.14 (*)    Calcium 8.6 (*)    GFR calc non Af Amer 53 (*)    All other components within normal limits  URINE CULTURE    IMAGES: CT abd/pelvis 04/26/18 ([place]): 1. Numerous irregular soft tissue masses within the bladder, concerning for recurrent urothelial carcinoma. Urologic consultation recommended. 2. No evidence of local/regional adenopathy or hematogenous metastatic disease. 3. There is also a urothelial irregularity of an upper pole calyx, which warrants further evaluation with retrograde pyelogram given risk of upper tract lesion. 4. Postsurgical changes of left nephrectomy and adrenalectomy.  5. Additional findings as above {See full report in [place].   EKG: 06/22/18: NSR, possible left atrial enlargement.   CV: N/A  Past Medical History:  Diagnosis Date  . Bronchitis   . Cancer ([place])    upper urinary tract transitional cell carcinoma  . Complication of anesthesia   . Family history of adverse reaction to anesthesia    BROTHER PONV  . GERD (gastroesophageal reflux disease)   . History of concussion  2015  no LOC  and no residual  . Hypertension   . Pneumonia AS CHILD, AND 1990'S  . PONV (postoperative nausea and vomiting)   . Psoriasis   . Renal mass, left    PELVIC AREA  . Type 2 diabetes mellitus ([place])     Past Surgical History:  Procedure Laterality Date  . CYSTOSCOPY N/A 01/01/2016   Procedure: CYSTOSCOPY FLEXIBLE;  Surgeon: Ardis Hughs, MD;  Location: [facility];  Service: Urology;  Laterality: N/A;  . CYSTOSCOPY W/ URETERAL STENT PLACEMENT Left 10/30/2015   Procedure: CYSTOSCOPY WITH LEFT  RETROGRADE, PYELOGGRAM URETEROSCOPY, BIOPSY AND STENT PLACEMENT;   Surgeon: Kathie Rhodes, MD;  Location: [facility];  Service: Urology;  Laterality: Left;  . ROBOT ASSITED LAPAROSCOPIC NEPHROURETERECTOMY Left 01/01/2016   Procedure: XI ROBOT ASSITED LAPAROSCOPIC LEFT NEPHROURETERECTOMY;  Surgeon: Ardis Hughs, MD;  Location: [facility];  Service: Urology;  Laterality: Left;    MEDICATIONS: . triamcinolone cream (KENALOG) 0.1 %  . buPROPion (WELLBUTRIN SR) 100 MG 12 hr tablet  . glimepiride (AMARYL) 4 MG tablet  . losartan (COZAAR) 25 MG tablet  . metFORMIN (GLUCOPHAGE) 500 MG tablet  . Multiple Vitamin (MULTI VITAMIN PO)  . varenicline (CHANTIX) 1 MG tablet   No current facility-administered medications for this encounter.     George Hugh [facility] Short Stay Center/Anesthesiology Phone [phone] 06/25/2018 10:38 AM

## 2018-06-25 NOTE — Progress Notes (Addendum)
PCP: richard herber  CARDIOLOGIST:none  INFO IN Epic: cmet results 06-22-18  INFO ON CHART:none  BLOOD THINNERS AND LAST DOSES: none ____________________________________  PATIENT SYMPTOMS AT TIME OF PREOP: none

## 2018-06-25 NOTE — Anesthesia Preprocedure Evaluation (Addendum)
Anesthesia Evaluation  Patient identified by MRN, date of birth, ID band Patient awake    Reviewed: Allergy & Precautions, H&P , NPO status , Patient's Chart, lab work & pertinent test results  History of Anesthesia Complications (+) PONV, Family history of anesthesia reaction and history of anesthetic complications  Airway Mallampati: II  TM Distance: >3 FB Neck ROM: Full    Dental no notable dental hx. (+) Teeth Intact, Dental Advisory Given   Pulmonary Current Smoker,    Pulmonary exam normal breath sounds clear to auscultation       Cardiovascular hypertension, Pt. on medications Normal cardiovascular exam Rhythm:Regular Rate:Normal     Neuro/Psych negative neurological ROS     GI/Hepatic Neg liver ROS, GERD  ,  Endo/Other  diabetes, Type 2  Renal/GU Renal disease     Musculoskeletal   Abdominal (+) + obese,   Peds  Hematology negative hematology ROS (+)   Anesthesia Other Findings   Reproductive/Obstetrics negative OB ROS                           Lab Results  Component Value Date   WBC 14.6 (H) 06/22/2018   HGB 13.4 06/22/2018   HCT 42.0 06/22/2018   MCV 98.4 06/22/2018   PLT 428 (H) 06/22/2018    Anesthesia Physical Anesthesia Plan  ASA: III  Anesthesia Plan: General   Post-op Pain Management:    Induction: Intravenous  PONV Risk Score and Plan: 3 and Treatment may vary due to age or medical condition, Dexamethasone, Ondansetron and Scopolamine patch - Pre-op  Airway Management Planned: LMA  Additional Equipment:   Intra-op Plan:   Post-operative Plan:   Informed Consent: I have reviewed the patients History and Physical, chart, labs and discussed the procedure including the risks, benefits and alternatives for the proposed anesthesia with the patient or authorized representative who has indicated his/her understanding and acceptance.   Dental advisory  given  Plan Discussed with: CRNA  Anesthesia Plan Comments: (PST note written 06/25/2018 by Myra Gianotti, PA-C. )       Anesthesia Quick Evaluation

## 2018-06-28 ENCOUNTER — Encounter (HOSPITAL_COMMUNITY): Payer: Self-pay | Admitting: *Deleted

## 2018-06-28 ENCOUNTER — Ambulatory Visit (HOSPITAL_COMMUNITY): Payer: Commercial Managed Care - PPO | Admitting: Physician Assistant

## 2018-06-28 ENCOUNTER — Observation Stay (HOSPITAL_COMMUNITY)
Admission: RE | Admit: 2018-06-28 | Discharge: 2018-06-29 | Disposition: A | Discharge status: Home / Self Care | Payer: Commercial Managed Care - PPO | Attending: Urology | Admitting: Urology

## 2018-06-28 ENCOUNTER — Encounter (HOSPITAL_COMMUNITY)
Admission: RE | Disposition: A | Discharge status: Home / Self Care | Payer: Self-pay | Source: Home / Self Care | Attending: Urology

## 2018-06-28 ENCOUNTER — Other Ambulatory Visit: Payer: Self-pay

## 2018-06-28 ENCOUNTER — Ambulatory Visit (HOSPITAL_COMMUNITY): Payer: Commercial Managed Care - PPO

## 2018-06-28 ENCOUNTER — Ambulatory Visit (HOSPITAL_COMMUNITY): Payer: Commercial Managed Care - PPO | Admitting: Anesthesiology

## 2018-06-28 DIAGNOSIS — Z807 Family history of other malignant neoplasms of lymphoid, hematopoietic and related tissues: Secondary | ICD-10-CM | POA: Diagnosis not present

## 2018-06-28 DIAGNOSIS — C679 Malignant neoplasm of bladder, unspecified: Secondary | ICD-10-CM | POA: Diagnosis present

## 2018-06-28 DIAGNOSIS — K219 Gastro-esophageal reflux disease without esophagitis: Secondary | ICD-10-CM | POA: Diagnosis not present

## 2018-06-28 DIAGNOSIS — Z801 Family history of malignant neoplasm of trachea, bronchus and lung: Secondary | ICD-10-CM | POA: Diagnosis not present

## 2018-06-28 DIAGNOSIS — C678 Malignant neoplasm of overlapping sites of bladder: Principal | ICD-10-CM | POA: Insufficient documentation

## 2018-06-28 DIAGNOSIS — Z6838 Body mass index (BMI) 38.0-38.9, adult: Secondary | ICD-10-CM | POA: Diagnosis not present

## 2018-06-28 DIAGNOSIS — Z808 Family history of malignant neoplasm of other organs or systems: Secondary | ICD-10-CM | POA: Insufficient documentation

## 2018-06-28 DIAGNOSIS — Z833 Family history of diabetes mellitus: Secondary | ICD-10-CM | POA: Insufficient documentation

## 2018-06-28 DIAGNOSIS — Z905 Acquired absence of kidney: Secondary | ICD-10-CM | POA: Insufficient documentation

## 2018-06-28 DIAGNOSIS — Z7984 Long term (current) use of oral hypoglycemic drugs: Secondary | ICD-10-CM | POA: Insufficient documentation

## 2018-06-28 DIAGNOSIS — Z79899 Other long term (current) drug therapy: Secondary | ICD-10-CM | POA: Insufficient documentation

## 2018-06-28 DIAGNOSIS — Z85528 Personal history of other malignant neoplasm of kidney: Secondary | ICD-10-CM | POA: Diagnosis not present

## 2018-06-28 DIAGNOSIS — I10 Essential (primary) hypertension: Secondary | ICD-10-CM | POA: Insufficient documentation

## 2018-06-28 DIAGNOSIS — F17200 Nicotine dependence, unspecified, uncomplicated: Secondary | ICD-10-CM | POA: Insufficient documentation

## 2018-06-28 DIAGNOSIS — E669 Obesity, unspecified: Secondary | ICD-10-CM | POA: Diagnosis not present

## 2018-06-28 HISTORY — PX: TRANSURETHRAL RESECTION OF BLADDER TUMOR: SHX2575

## 2018-06-28 LAB — CBC
HCT: 44.2 % (ref 36.0–46.0)
Hemoglobin: 13.8 g/dL (ref 12.0–15.0)
MCH: 31.1 pg (ref 26.0–34.0)
MCHC: 31.2 g/dL (ref 30.0–36.0)
MCV: 99.5 fL (ref 80.0–100.0)
Platelets: 408 10*3/uL — ABNORMAL HIGH (ref 150–400)
RBC: 4.44 MIL/uL (ref 3.87–5.11)
RDW: 11.9 % (ref 11.5–15.5)
WBC: 20.6 10*3/uL — ABNORMAL HIGH (ref 4.0–10.5)
nRBC: 0 % (ref 0.0–0.2)

## 2018-06-28 LAB — BASIC METABOLIC PANEL
Anion gap: 7 (ref 5–15)
BUN: 26 mg/dL — ABNORMAL HIGH (ref 6–20)
CO2: 21 mmol/L — ABNORMAL LOW (ref 22–32)
Calcium: 7.8 mg/dL — ABNORMAL LOW (ref 8.9–10.3)
Chloride: 108 mmol/L (ref 98–111)
Creatinine, Ser: 0.97 mg/dL (ref 0.44–1.00)
GFR calc Af Amer: >60 mL/min (ref >60)
GLUCOSE: 230 mg/dL — AB (ref 70–99)
Potassium: 4.7 mmol/L (ref 3.5–5.1)
Sodium: 136 mmol/L (ref 135–145)

## 2018-06-28 LAB — GLUCOSE, CAPILLARY
Glucose-Capillary: 152 mg/dL — ABNORMAL HIGH (ref 70–99)
Glucose-Capillary: 174 mg/dL — ABNORMAL HIGH (ref 70–99)

## 2018-06-28 SURGERY — TURBT (TRANSURETHRAL RESECTION OF BLADDER TUMOR)
Anesthesia: General | Site: Bladder | Laterality: Right

## 2018-06-28 MED ORDER — HYDROMORPHONE HCL 1 MG/ML IJ SOLN: 0.2500 mg | INTRAMUSCULAR | Status: DC | PRN

## 2018-06-28 MED ORDER — MEPERIDINE HCL 50 MG/ML IJ SOLN: 6.2500 mg | INTRAMUSCULAR | Status: DC | PRN

## 2018-06-28 MED ORDER — SUGAMMADEX SODIUM 200 MG/2ML IV SOLN
INTRAVENOUS | Status: DC | PRN
  Administered 2018-06-28: 200 mg via INTRAVENOUS

## 2018-06-28 MED ORDER — SCOPOLAMINE 1 MG/3DAYS TD PT72
1.0000 | MEDICATED_PATCH | TRANSDERMAL | Status: DC
  Administered 2018-06-28: 1.5 mg via TRANSDERMAL
  Filled 2018-06-28: qty 1

## 2018-06-28 MED ORDER — ONDANSETRON HCL 4 MG/2ML IJ SOLN
INTRAMUSCULAR | Status: AC
  Filled 2018-06-28: qty 2

## 2018-06-28 MED ORDER — ONDANSETRON HCL 4 MG/2ML IJ SOLN
4.0000 mg | Freq: Once | INTRAMUSCULAR | Status: AC | PRN
  Administered 2018-06-28: 4 mg via INTRAVENOUS

## 2018-06-28 MED ORDER — FENTANYL CITRATE (PF) 100 MCG/2ML IJ SOLN
INTRAMUSCULAR | Status: AC
  Filled 2018-06-28: qty 2

## 2018-06-28 MED ORDER — PROPOFOL 10 MG/ML IV BOLUS
INTRAVENOUS | Status: DC | PRN
  Administered 2018-06-28: 50 mg via INTRAVENOUS
  Administered 2018-06-28: 140 mg via INTRAVENOUS

## 2018-06-28 MED ORDER — SODIUM CHLORIDE 0.9 % IR SOLN
Status: DC | PRN
  Administered 2018-06-28: 48000 mL

## 2018-06-28 MED ORDER — PROPOFOL 10 MG/ML IV BOLUS
INTRAVENOUS | Status: AC
  Filled 2018-06-28: qty 20

## 2018-06-28 MED ORDER — SUGAMMADEX SODIUM 200 MG/2ML IV SOLN
INTRAVENOUS | Status: AC
  Filled 2018-06-28: qty 2

## 2018-06-28 MED ORDER — BELLADONNA ALKALOIDS-OPIUM 16.2-60 MG RE SUPP
RECTAL | Status: DC | PRN
  Administered 2018-06-28: 1 via RECTAL

## 2018-06-28 MED ORDER — ACETAMINOPHEN 10 MG/ML IV SOLN: 1000.0000 mg | Freq: Once | INTRAVENOUS | Status: DC | PRN

## 2018-06-28 MED ORDER — ONDANSETRON HCL 4 MG/2ML IJ SOLN
INTRAMUSCULAR | Status: AC
  Administered 2018-06-28: 17:00:00
  Filled 2018-06-28: qty 2

## 2018-06-28 MED ORDER — VARENICLINE TARTRATE 1 MG PO TABS
1.0000 mg | ORAL_TABLET | Freq: Two times a day (BID) | ORAL | Status: DC
  Administered 2018-06-28 – 2018-06-29 (×2): 1 mg via ORAL
  Filled 2018-06-28 (×2): qty 1

## 2018-06-28 MED ORDER — BELLADONNA ALKALOIDS-OPIUM 16.2-30 MG RE SUPP
RECTAL | Status: AC
  Filled 2018-06-28: qty 1

## 2018-06-28 MED ORDER — METFORMIN HCL 500 MG PO TABS
500.0000 mg | ORAL_TABLET | Freq: Two times a day (BID) | ORAL | Status: DC
  Administered 2018-06-29: 500 mg via ORAL
  Filled 2018-06-28: qty 1

## 2018-06-28 MED ORDER — SODIUM CHLORIDE 0.9 % IR SOLN
3000.0000 mL | Status: DC
  Administered 2018-06-28 – 2018-06-29 (×7): 3000 mL

## 2018-06-28 MED ORDER — ZOLPIDEM TARTRATE 5 MG PO TABS: 5.0000 mg | ORAL_TABLET | Freq: Every evening | ORAL | Status: DC | PRN

## 2018-06-28 MED ORDER — TRAMADOL HCL 50 MG PO TABS: 50.0000 mg | ORAL_TABLET | Freq: Four times a day (QID) | ORAL | Status: DC | PRN

## 2018-06-28 MED ORDER — MIDAZOLAM HCL 5 MG/5ML IJ SOLN
INTRAMUSCULAR | Status: DC | PRN
  Administered 2018-06-28: 2 mg via INTRAVENOUS

## 2018-06-28 MED ORDER — ROCURONIUM BROMIDE 100 MG/10ML IV SOLN
INTRAVENOUS | Status: DC | PRN
  Administered 2018-06-28: 5 mg via INTRAVENOUS
  Administered 2018-06-28 (×2): 10 mg via INTRAVENOUS
  Administered 2018-06-28: 30 mg via INTRAVENOUS
  Administered 2018-06-28 (×3): 10 mg via INTRAVENOUS

## 2018-06-28 MED ORDER — LOSARTAN POTASSIUM 25 MG PO TABS
25.0000 mg | ORAL_TABLET | Freq: Every day | ORAL | Status: DC
  Administered 2018-06-29: 25 mg via ORAL
  Filled 2018-06-28: qty 1

## 2018-06-28 MED ORDER — LIDOCAINE HCL URETHRAL/MUCOSAL 2 % EX GEL
CUTANEOUS | Status: DC | PRN
  Administered 2018-06-28: 1 via TOPICAL

## 2018-06-28 MED ORDER — MIDAZOLAM HCL 2 MG/2ML IJ SOLN
INTRAMUSCULAR | Status: AC
  Filled 2018-06-28: qty 2

## 2018-06-28 MED ORDER — LIDOCAINE HCL URETHRAL/MUCOSAL 2 % EX GEL
CUTANEOUS | Status: AC
  Filled 2018-06-28: qty 5

## 2018-06-28 MED ORDER — HYDRALAZINE HCL 20 MG/ML IJ SOLN: 5.0000 mg | INTRAMUSCULAR | Status: DC | PRN

## 2018-06-28 MED ORDER — CEFAZOLIN SODIUM-DEXTROSE 2-4 GM/100ML-% IV SOLN
2.0000 g | INTRAVENOUS | Status: AC
  Administered 2018-06-28: 2 g via INTRAVENOUS
  Filled 2018-06-28: qty 100

## 2018-06-28 MED ORDER — HYDROCODONE-ACETAMINOPHEN 7.5-325 MG PO TABS: 1.0000 | ORAL_TABLET | Freq: Once | ORAL | Status: DC | PRN

## 2018-06-28 MED ORDER — BUPROPION HCL ER (SR) 100 MG PO TB12
100.0000 mg | ORAL_TABLET | Freq: Two times a day (BID) | ORAL | Status: DC
  Administered 2018-06-28 – 2018-06-29 (×2): 100 mg via ORAL
  Filled 2018-06-28 (×2): qty 1

## 2018-06-28 MED ORDER — FENTANYL CITRATE (PF) 100 MCG/2ML IJ SOLN
INTRAMUSCULAR | Status: DC | PRN
  Administered 2018-06-28 (×6): 50 ug via INTRAVENOUS
  Administered 2018-06-28: 100 ug via INTRAVENOUS

## 2018-06-28 MED ORDER — 0.9 % SODIUM CHLORIDE (POUR BTL) OPTIME
TOPICAL | Status: DC | PRN
  Administered 2018-06-28: 1000 mL

## 2018-06-28 MED ORDER — DEXAMETHASONE SODIUM PHOSPHATE 10 MG/ML IJ SOLN
INTRAMUSCULAR | Status: DC | PRN
  Administered 2018-06-28: 10 mg via INTRAVENOUS

## 2018-06-28 MED ORDER — BELLADONNA ALKALOIDS-OPIUM 16.2-60 MG RE SUPP: 1.0000 | Freq: Three times a day (TID) | RECTAL | Status: DC | PRN

## 2018-06-28 MED ORDER — LIDOCAINE HCL (CARDIAC) PF 100 MG/5ML IV SOSY
PREFILLED_SYRINGE | INTRAVENOUS | Status: DC | PRN
  Administered 2018-06-28: 100 mg via INTRAVENOUS

## 2018-06-28 MED ORDER — GLIMEPIRIDE 2 MG PO TABS
2.0000 mg | ORAL_TABLET | Freq: Every day | ORAL | Status: DC
  Administered 2018-06-29: 2 mg via ORAL
  Filled 2018-06-28: qty 1

## 2018-06-28 MED ORDER — LACTATED RINGERS IV SOLN
INTRAVENOUS | Status: DC
  Administered 2018-06-28 (×3): via INTRAVENOUS

## 2018-06-28 SURGICAL SUPPLY — 29 items
BAG URINE DRAINAGE (UROLOGICAL SUPPLIES) ×1 IMPLANT
BAG URO CATCHER STRL LF (MISCELLANEOUS) ×3 IMPLANT
BASKET ZERO TIP NITINOL 2.4FR (BASKET) IMPLANT
CATH FOLEY 2WAY SLVR 30CC 24FR (CATHETERS) IMPLANT
CATH FOLEY 3WAY 30CC 20FR (CATHETERS) ×1 IMPLANT
CATH URET 5FR 28IN OPEN ENDED (CATHETERS) ×3 IMPLANT
CLOTH BEACON ORANGE TIMEOUT ST (SAFETY) ×3 IMPLANT
COVER WAND RF STERILE (DRAPES) IMPLANT
ELECT REM PT RETURN 15FT ADLT (MISCELLANEOUS) ×3 IMPLANT
EVACUATOR MICROVAS BLADDER (UROLOGICAL SUPPLIES) IMPLANT
EXTRACTOR STONE 1.7FRX115CM (UROLOGICAL SUPPLIES) IMPLANT
GLOVE BIOGEL M 8.0 STRL (GLOVE) ×2 IMPLANT
GLOVE BIOGEL M STRL SZ7.5 (GLOVE) ×3 IMPLANT
GOWN STRL REUS W/TWL XL LVL3 (GOWN DISPOSABLE) ×3 IMPLANT
GUIDEWIRE ANG ZIPWIRE 038X150 (WIRE) IMPLANT
GUIDEWIRE STR DUAL SENSOR (WIRE) ×3 IMPLANT
LOOP CUT BIPOLAR 24F LRG (ELECTROSURGICAL) ×1 IMPLANT
MANIFOLD NEPTUNE II (INSTRUMENTS) ×3 IMPLANT
NDL SAFETY ECLIPSE 18X1.5 (NEEDLE) ×2 IMPLANT
NEEDLE HYPO 18GX1.5 SHARP (NEEDLE)
PACK CYSTO (CUSTOM PROCEDURE TRAY) ×3 IMPLANT
SET ASPIRATION TUBING (TUBING) IMPLANT
SHEATH URETERAL 12FRX28CM (UROLOGICAL SUPPLIES) IMPLANT
SHEATH URETERAL 12FRX35CM (MISCELLANEOUS) IMPLANT
SYRINGE IRR TOOMEY STRL 70CC (SYRINGE) ×1 IMPLANT
TUBING CONNECTING 10 (TUBING) ×3 IMPLANT
TUBING UROLOGY SET (TUBING) ×3 IMPLANT
WATER STERILE IRR 3000ML UROMA (IV SOLUTION) ×3 IMPLANT
WIRE COONS/BENSON .038X145CM (WIRE) IMPLANT

## 2018-06-28 NOTE — Anesthesia Postprocedure Evaluation (Signed)
Anesthesia Post Note  Patient: Joan Ramirez  Procedure(s) Performed: TRANSURETHRAL RESECTION OF BLADDER TUMOR (TURBT) (N/A Bladder)     Patient location during evaluation: PACU Anesthesia Type: General Level of consciousness: awake and alert Pain management: pain level controlled Vital Signs Assessment: post-procedure vital signs reviewed and stable Respiratory status: spontaneous breathing, nonlabored ventilation, respiratory function stable and patient connected to nasal cannula oxygen Cardiovascular status: blood pressure returned to baseline and stable Postop Assessment: no apparent nausea or vomiting Anesthetic complications: no    Last Vitals:  Vitals:   06/28/18 1630 06/28/18 1645  BP: 132/69 128/69  Pulse: 83 76  Resp: 19 (!) 23  Temp:    SpO2: 94% (!) 87%    Last Pain:  Vitals:   06/28/18 1533  TempSrc:   PainSc: 0-No pain                 Barnet Glasgow

## 2018-06-28 NOTE — H&P (View-Only) (Signed)
f/u for transitional cell carcinoma  HPI: Joan Ramirez is a 58 year-old female established patient who is here for surveillance of bladder cancer.  She did not undergo a TURBT. The patient had a left nephrouretectomy. Surgery date:.   Tumor Pathology: T1aN0.   The patient did not have any post-operative bladder instillations.   She is not having pain in new locations. She has had blood in her urine recently. She has not recently had unwanted weight loss.   A the patient was lost to follow-up, having been somewhat put off by my comments about her continued smoking at her initial postop follow-up visit. She has not been seen for 2 years. She has since developed hematuria intermittently for the past several months. A CT scan was performed by her primary care provider on April 30, 2018. This demonstrated numerous peripherally oriented irregular rounded nodular soft tissue density filling defects within the bladder lumen. The largest 1 of these measures up to 3.2 cm in diameter. In addition, the patient had a irregularity of the upper pole of her left kidney.   The patient denies any fevers or chills. She is current we not having any hematuria. She denies any dysuria. He continues to smoke.     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 Neoplasm of unspecified behavior of unspecified kidney, Neoplasm of renal pelvis - 10/16/15 Benign Neo Lft adrenal gland, Adrenal adenoma, left -  10/16/15 Benign Neo Rt adrenal gland, Adrenal adenoma, right - 16-Oct-2015 Hydronephrosis Unspec, Hydronephrosis, left - 10/16/15 Renal calculus, Renal calculus, bilateral - 10/16/15    NON-GU PMH: Encounter for general adult medical examination without abnormal findings, Encounter for preventive health examination - Oct 16, 2015 Personal history of other diseases of the respiratory system, History of asthma - 10/16/2015 Personal history of other endocrine, nutritional and metabolic disease, History of diabetes mellitus - Oct 16, 2015    FAMILY HISTORY: Death of family member - Runs In Family Diabetes - Runs In Family Lung Cancer - Runs In Family lymphoma - Runs In Family skin cancer - Runs In Family   SOCIAL HISTORY: Marital Status: Single Preferred Language: English; Ethnicity: Not Hispanic Or Latino; Race: White     Notes: Occupation, Alcohol use, Single, Caffeine use, Current every day smoker   REVIEW OF SYSTEMS:    GU Review Female:   Patient denies frequent urination, hard to postpone urination, burning /pain with urination, get up at night to urinate, leakage of urine, stream starts and stops, trouble starting your stream, have to strain to urinate, and being pregnant.  Gastrointestinal (Upper):   Patient denies nausea, vomiting, and indigestion/ heartburn.  Gastrointestinal (Lower):   Patient reports diarrhea. Patient denies constipation.  Constitutional:   Patient denies fever, night sweats, weight loss, and fatigue.  Skin:   Patient denies skin rash/ lesion and itching.  Eyes:   Patient denies blurred vision and double vision.  Ears/ Nose/ Throat:   Patient denies sore throat and sinus problems.  Hematologic/Lymphatic:   Patient denies swollen glands and easy bruising.  Cardiovascular:   Patient denies leg swelling and chest pains.  Respiratory:   Patient denies cough and shortness of breath.  Endocrine:   Patient denies excessive thirst.  Musculoskeletal:   Patient denies joint pain and back pain.   Neurological:   Patient denies headaches and dizziness.  Psychologic:   Patient denies depression and anxiety.   Notes: vaginal itching    VITAL SIGNS:      05/22/2018 01:21 PM  Weight 212 lb / 96.16 kg  BP 159/95 mmHg  Pulse 89 /min  Temperature 98.7 F / 37.0 C   MULTI-SYSTEM PHYSICAL EXAMINATION:    Constitutional: Well-nourished. No physical deformities. Normally developed. Good grooming.  Respiratory: Normal breath sounds. No labored breathing, no use of accessory muscles.   Cardiovascular: Regular rate and rhythm. No murmur, no gallop. Normal temperature, normal extremity pulses, no swelling, no varicosities.   Gastrointestinal: No mass, no tenderness, no rigidity, non obese abdomen. The patient has a ventral hernia that is supraumbilical and slightly right lateral of midline this is easily reducible and nontender.     PAST DATA REVIEWED:  Source Of History:  Patient  Records Review:   Pathology Reports, Previous Doctor Records, Previous Patient Records, POC Tool  X-Ray Review: C.T. Abdomen/Pelvis: Reviewed Report. Discussed With Patient.     PROCEDURES:          Urinalysis w/Scope Dipstick Dipstick Cont'd Micro  Color: Yellow Bilirubin: Neg mg/dL WBC/hpf: 10 - 20/hpf  Appearance: Cloudy Ketones: Neg mg/dL RBC/hpf: 3 - 10/hpf  Specific Gravity: 1.025 Blood: 2+ ery/uL Bacteria: Mod (26-50/hpf)  pH: <=5.0 Protein: 3+ mg/dL Cystals: Amorph Urates  Glucose: Neg mg/dL Urobilinogen: 0.2 mg/dL Casts: NS (Not Seen)    Nitrites: Neg Trichomonas: Not Present    Leukocyte Esterase: Trace leu/uL Mucous: Not Present      Epithelial Cells: 0 - 5/hpf      Yeast: NS (Not Seen)      Sperm: Not Present    ASSESSMENT:      ICD-10 Details  1 GU:   Renal pelvis cancer, 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 Based on her CT scan and her symptoms she likely has recurrence of her cancer in her bladder and possibly in her left kidney. Our plan is to proceed to the operating room for cystoscopy, transurethral resection of her bladder tumor as well as diagnostic ureteroscopy and possible ureteral biopsy. I described the surgery for the patient as well as the risks and the benefits. The patient had a greater understanding of the surgery itself as well as the potential side effects and complications. We will try to get this scheduled in the next expeditious fashion.

## 2018-06-28 NOTE — Anesthesia Procedure Notes (Signed)
Procedure Name: LMA Insertion Date/Time: 06/28/2018 12:35 PM Performed by: Glory Buff, CRNA Pre-anesthesia Checklist: Patient identified, Emergency Drugs available, Suction available and Patient being monitored Patient Re-evaluated:Patient Re-evaluated prior to induction Oxygen Delivery Method: Circle system utilized Preoxygenation: Pre-oxygenation with 100% oxygen Induction Type: IV induction LMA: LMA inserted LMA Size: 4.0 Number of attempts: 1 Placement Confirmation: positive ETCO2 Tube secured with: Tape Dental Injury: Teeth and Oropharynx as per pre-operative assessment

## 2018-06-28 NOTE — H&P (Signed)
f/u for transitional cell carcinoma  HPI: Joan Ramirez is a 58 year-old female established patient who is here for surveillance of bladder cancer.  She did not undergo a TURBT. The patient had a left nephrouretectomy. Surgery date:.   Tumor Pathology: T1aN0.   The patient did not have any post-operative bladder instillations.   She is not having pain in new locations. She has had blood in her urine recently. She has not recently had unwanted weight loss.   A the patient was lost to follow-up, having been somewhat put off by my comments about her continued smoking at her initial postop follow-up visit. She has not been seen for 2 years. She has since developed hematuria intermittently for the past several months. A CT scan was performed by her primary care provider on April 30, 2018. This demonstrated numerous peripherally oriented irregular rounded nodular soft tissue density filling defects within the bladder lumen. The largest 1 of these measures up to 3.2 cm in diameter. In addition, the patient had a irregularity of the upper pole of her left kidney.   The patient denies any fevers or chills. She is current we not having any hematuria. She denies any dysuria. He continues to smoke.     ALLERGIES: No Allergies    MEDICATIONS: Metformin Hcl 500 mg tablet  Bupropion Hcl  Chantix  Glimepiride  Losartan Potassium 25 mg tablet     GU PSH: Cysto Uretero Biopsy Fulgura 27-Sep-2015 Cystoscopy Insert Stent - 09-27-15 Inject For cystogram - 2015-09-27 Lap Nephro Ureterectomy, Left - 09-27-15      PSH Notes: Cystoscopy With Ureteroscopy For Biopsy Left, Cystoscopy With Insertion Of Ureteral Stent Left, No Surgical Problems   NON-GU PSH: None   GU PMH: Renal pelvis cancer, left - 2015/09/27 Ureteral Cancer, Unspec, Transitional cell carcinoma of ureter - Sep 27, 2015 Neoplasm of unspecified behavior of unspecified kidney, Neoplasm of renal pelvis - 09-27-2015 Benign Neo Lft adrenal gland, Adrenal adenoma, left -  September 27, 2015 Benign Neo Rt adrenal gland, Adrenal adenoma, right - 09-27-15 Hydronephrosis Unspec, Hydronephrosis, left - September 27, 2015 Renal calculus, Renal calculus, bilateral - 2015-09-27    NON-GU PMH: Encounter for general adult medical examination without abnormal findings, Encounter for preventive health examination - 27-Sep-2015 Personal history of other diseases of the respiratory system, History of asthma - 09/27/2015 Personal history of other endocrine, nutritional and metabolic disease, History of diabetes mellitus - 27-Sep-2015    FAMILY HISTORY: Death of family member - Runs In Family Diabetes - Runs In Family Lung Cancer - Runs In Family lymphoma - Runs In Family skin cancer - Runs In Family   SOCIAL HISTORY: Marital Status: Single Preferred Language: English; Ethnicity: Not Hispanic Or Latino; Race: White     Notes: Occupation, Alcohol use, Single, Caffeine use, Current every day smoker   REVIEW OF SYSTEMS:    GU Review Female:   Patient denies frequent urination, hard to postpone urination, burning /pain with urination, get up at night to urinate, leakage of urine, stream starts and stops, trouble starting your stream, have to strain to urinate, and being pregnant.  Gastrointestinal (Upper):   Patient denies nausea, vomiting, and indigestion/ heartburn.  Gastrointestinal (Lower):   Patient reports diarrhea. Patient denies constipation.  Constitutional:   Patient denies fever, night sweats, weight loss, and fatigue.  Skin:   Patient denies skin rash/ lesion and itching.  Eyes:   Patient denies blurred vision and double vision.  Ears/ Nose/ Throat:   Patient denies sore throat and sinus problems.  Hematologic/Lymphatic:   Patient denies swollen glands and easy bruising.  Cardiovascular:   Patient denies leg swelling and chest pains.  Respiratory:   Patient denies cough and shortness of breath.  Endocrine:   Patient denies excessive thirst.  Musculoskeletal:   Patient denies joint pain and back pain.   Neurological:   Patient denies headaches and dizziness.  Psychologic:   Patient denies depression and anxiety.   Notes: vaginal itching    VITAL SIGNS:      05/22/2018 01:21 PM  Weight 212 lb / 96.16 kg  BP 159/95 mmHg  Pulse 89 /min  Temperature 98.7 F / 37.0 C   MULTI-SYSTEM PHYSICAL EXAMINATION:    Constitutional: Well-nourished. No physical deformities. Normally developed. Good grooming.  Respiratory: Normal breath sounds. No labored breathing, no use of accessory muscles.   Cardiovascular: Regular rate and rhythm. No murmur, no gallop. Normal temperature, normal extremity pulses, no swelling, no varicosities.   Gastrointestinal: No mass, no tenderness, no rigidity, non obese abdomen. The patient has a ventral hernia that is supraumbilical and slightly right lateral of midline this is easily reducible and nontender.     PAST DATA REVIEWED:  Source Of History:  Patient  Records Review:   Pathology Reports, Previous Doctor Records, Previous Patient Records, POC Tool  X-Ray Review: C.T. Abdomen/Pelvis: Reviewed Report. Discussed With Patient.     PROCEDURES:          Urinalysis w/Scope Dipstick Dipstick Cont'd Micro  Color: Yellow Bilirubin: Neg mg/dL WBC/hpf: 10 - 20/hpf  Appearance: Cloudy Ketones: Neg mg/dL RBC/hpf: 3 - 10/hpf  Specific Gravity: 1.025 Blood: 2+ ery/uL Bacteria: Mod (26-50/hpf)  pH: <=5.0 Protein: 3+ mg/dL Cystals: Amorph Urates  Glucose: Neg mg/dL Urobilinogen: 0.2 mg/dL Casts: NS (Not Seen)    Nitrites: Neg Trichomonas: Not Present    Leukocyte Esterase: Trace leu/uL Mucous: Not Present      Epithelial Cells: 0 - 5/hpf      Yeast: NS (Not Seen)      Sperm: Not Present    ASSESSMENT:      ICD-10 Details  1 GU:   Renal pelvis cancer, 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 Based on her CT scan and her symptoms she likely has recurrence of her cancer in her bladder and possibly in her left kidney. Our plan is to proceed to the operating room for cystoscopy, transurethral resection of her bladder tumor as well as diagnostic ureteroscopy and possible ureteral biopsy. I described the surgery for the patient as well as the risks and the benefits. The patient had a greater understanding of the surgery itself as well as the potential side effects and complications. We will try to get this scheduled in the next expeditious fashion.

## 2018-06-28 NOTE — Op Note (Signed)
Preoperative diagnosis:  1. Bladder cancer  Postoperative diagnosis:  1. Bladder cancer  Procedure: 1. Transurethral resection of bladder tumor, greater than 5 cm  Surgeon: Ardis Hughs, MD  Anesthesia: General  Complications: None  Intraoperative findings:  #1: The patient had 10-15 large bladder tumors.  The biggest one was approximately 5 cm. #2: Given the volume of her bladder cancer, we opted to stage this procedure.  At her next follow-up surgery we will perform a right retrograde as well as right diagnostic ureteroscopy and stent placement.  EBL: Minimal  Specimens: None  Indication: Joan Ramirez is a 58 y.o. patient with bladder tumors as seen on CT scan.  She has a history of transitional cell carcinoma of the left upper tract and is status post left nephro ureterectomy.  After reviewing the management options for treatment, he elected to proceed with the above surgical procedure(s). We have discussed the potential benefits and risks of the procedure, side effects of the proposed treatment, the likelihood of the patient achieving the goals of the procedure, and any potential problems that might occur during the procedure or recuperation. Informed consent has been obtained.  Description of procedure:  The patient was taken to the operating room and general anesthesia was induced.  The patient was placed in the dorsal lithotomy position, prepped and draped in the usual sterile fashion, and preoperative antibiotics were administered. A preoperative time-out was performed.   21 French 30 degrees cystoscope was gently passed through the patient's urethra and into the bladder under visual guidance.  Cystoscopy demonstrated significant amounts of tumor in overlapping areas.  The largest tumor was on the left lateral wall and was approximately 5 cm.  I then removed the 21 French sheath for a 26 French sheath and inserted this with the visual obturator.  I then exchanged the  obturator for the loop resection element.  I then proceeded to resect as many tumors as possible over the next 280 minutes.  All the bladder tumor was irrigated out and hemostasis was achieved.  Once it was clear that I was not getting get the patient tumor free at this visit we opted to stage the surgery.  I placed a B&O suppository into the patient's rectum and lidocaine jelly into her urethra.  I inserted a 20 [place] three-way Foley catheter and inserted 10 cc into the balloon.  CBI was started and the patient was transferred to the PACU in stable condition.  The patient will be rescheduled in 2 weeks for repeat TURBT.  Ardis Hughs, M.D.

## 2018-06-28 NOTE — Transfer of Care (Signed)
Immediate Anesthesia Transfer of Care Note  Patient: Joan Ramirez  Procedure(s) Performed: TRANSURETHRAL RESECTION OF BLADDER TUMOR (TURBT) (N/A Bladder)  Patient Location: PACU  Anesthesia Type:General  Level of Consciousness: awake, alert  and oriented  Airway & Oxygen Therapy: Patient Spontanous Breathing and Patient connected to face mask oxygen  Post-op Assessment: Report given to RN and Post -op Vital signs reviewed and stable  Post vital signs: Reviewed and stable  Last Vitals:  Vitals Value Taken Time  BP 142/101 06/28/2018  3:33 PM  Temp    Pulse 89 06/28/2018  3:35 PM  Resp 13 06/28/2018  3:35 PM  SpO2 96 % 06/28/2018  3:35 PM  Vitals shown include unvalidated device data.  Last Pain:  Vitals:   06/28/18 1009  TempSrc: Oral         Complications: No apparent anesthesia complications

## 2018-06-29 ENCOUNTER — Other Ambulatory Visit: Payer: Self-pay | Admitting: Urology

## 2018-06-29 ENCOUNTER — Encounter (HOSPITAL_COMMUNITY): Payer: Self-pay | Admitting: Urology

## 2018-06-29 DIAGNOSIS — C678 Malignant neoplasm of overlapping sites of bladder: Secondary | ICD-10-CM | POA: Diagnosis not present

## 2018-06-29 MED ORDER — PHENAZOPYRIDINE HCL 200 MG PO TABS: 200.0000 mg | ORAL_TABLET | Freq: Three times a day (TID) | ORAL | 0 refills | Status: DC | PRN

## 2018-06-29 MED ORDER — TRAMADOL HCL 50 MG PO TABS: 50.0000 mg | ORAL_TABLET | Freq: Four times a day (QID) | ORAL | 0 refills | Status: DC | PRN

## 2018-06-29 NOTE — Discharge Instructions (Signed)

## 2018-06-29 NOTE — Discharge Summary (Signed)
Date of admission: 06/28/2018  Date of discharge: 06/29/2018  Admission diagnosis: bladder cancer, overlapping sites  Discharge diagnosis: same  Secondary diagnoses:  Patient Active Problem List   Diagnosis Date Noted  . Bladder cancer ([place]) 06/28/2018  . 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 Cancer of renal pelvis ([place]) 01/01/2016    Procedures performed: Procedure(s): TRANSURETHRAL RESECTION OF BLADDER TUMOR (TURBT)  History and Physical: For full details, please see admission history and physical. Briefly, Joan Ramirez is a 58 y.o. year old patient with large volume bladder cancer.   Hospital Course: Patient tolerated the procedure well.  She was then transferred to the floor after an uneventful PACU stay.  Her hospital course was uncomplicated.  On POD#1 she had met discharge criteria: was eating a regular diet, was up and ambulating independently,  pain was well controlled, was voiding without a catheter, and was ready to for discharge.   Laboratory values:  Recent Labs    06/28/18 1836  WBC 20.6*  HGB 13.8  HCT 44.2   Recent Labs    06/28/18 1836  NA 136  K 4.7  CL 108  CO2 21*  GLUCOSE 230*  BUN 26*  CREATININE 0.97  CALCIUM 7.8*   No results for input(s): LABPT, INR in the last 72 hours. No results for input(s): LABURIN in the last 72 hours. Results for orders placed or performed during the hospital encounter of 06/22/18  Urine culture     Status: None   Collection Time: 06/22/18 11:26 AM  Result Value Ref Range Status   Specimen Description URINE, CLEAN CATCH  Final   Special Requests   Final    NONE Performed at [facility] [address], [place], [place] [address]    Culture NO GROWTH  Final   Report Status 06/23/2018 FINAL  Final    Disposition: Home  Discharge instruction: The patient was instructed to be ambulatory but told to refrain from heavy lifting, strenuous activity, or driving.   Discharge  medications:  Allergies as of 06/29/2018   No Known Allergies     Medication List    TAKE these medications   buPROPion 100 MG 12 hr tablet Commonly known as:  WELLBUTRIN SR Take 100 mg by mouth 2 (two) times daily.   glimepiride 4 MG tablet Commonly known as:  AMARYL Take 2 mg by mouth daily with breakfast.   losartan 25 MG tablet Commonly known as:  COZAAR Take 25 mg by mouth daily.   metFORMIN 500 MG tablet Commonly known as:  GLUCOPHAGE Take 500 mg by mouth 2 (two) times daily.   MULTI VITAMIN PO Take 1 packet by mouth daily. Mix the contents of package in water daily   phenazopyridine 200 MG tablet Commonly known as:  PYRIDIUM Take 1 tablet (200 mg total) by mouth 3 (three) times daily as needed for pain.   traMADol 50 MG tablet Commonly known as:  ULTRAM Take 1-2 tablets (50-100 mg total) by mouth every 6 (six) hours as needed for moderate pain.   triamcinolone cream 0.1 % Commonly known as:  KENALOG Apply 1 application topically 2 (two) times daily.   varenicline 1 MG tablet Commonly known as:  CHANTIX Take 1 mg by mouth 2 (two) times daily.       Followup:  Follow-up Information    Ardis Hughs, MD In 2 weeks.   Specialty:  Urology Why:  for repeat surgery Contact information: [place]  [place] [phone]

## 2018-07-10 ENCOUNTER — Encounter (HOSPITAL_COMMUNITY): Payer: Self-pay | Admitting: *Deleted

## 2018-07-10 ENCOUNTER — Other Ambulatory Visit: Payer: Self-pay

## 2018-07-11 NOTE — Anesthesia Preprocedure Evaluation (Addendum)
Anesthesia Evaluation  Patient identified by MRN, date of birth, ID band Patient awake    Reviewed: Allergy & Precautions, NPO status , Patient's Chart, lab work & pertinent test results  History of Anesthesia Complications (+) PONV and history of anesthetic complications  Airway Mallampati: II  TM Distance: >3 FB Neck ROM: Full    Dental no notable dental hx.    Pulmonary former smoker,    Pulmonary exam normal breath sounds clear to auscultation       Cardiovascular hypertension, Pt. on medications Normal cardiovascular exam Rhythm:Regular Rate:Normal  ECG: NSR, rate 88   Neuro/Psych negative neurological ROS  negative psych ROS   GI/Hepatic negative GI ROS, Neg liver ROS,   Endo/Other  diabetes, Oral Hypoglycemic Agents  Renal/GU negative Renal ROS     Musculoskeletal negative musculoskeletal ROS (+)   Abdominal (+) + obese,   Peds  Hematology negative hematology ROS (+)   Anesthesia Other Findings BLADDER CANCER  Reproductive/Obstetrics                            Anesthesia Physical Anesthesia Plan  ASA: III  Anesthesia Plan: General   Post-op Pain Management:    Induction: Intravenous  PONV Risk Score and Plan: 4 or greater and Scopolamine patch - Pre-op, Midazolam, Dexamethasone, Ondansetron and Treatment may vary due to age or medical condition  Airway Management Planned: Oral ETT and LMA  Additional Equipment:   Intra-op Plan:   Post-operative Plan: Extubation in OR  Informed Consent: I have reviewed the patients History and Physical, chart, labs and discussed the procedure including the risks, benefits and alternatives for the proposed anesthesia with the patient or authorized representative who has indicated his/her understanding and acceptance.     Dental advisory given  Plan Discussed with: CRNA  Anesthesia Plan Comments:        Anesthesia Quick  Evaluation

## 2018-07-12 ENCOUNTER — Ambulatory Visit (HOSPITAL_COMMUNITY): Payer: Commercial Managed Care - PPO

## 2018-07-12 ENCOUNTER — Ambulatory Visit (HOSPITAL_COMMUNITY): Payer: Commercial Managed Care - PPO | Admitting: Anesthesiology

## 2018-07-12 ENCOUNTER — Ambulatory Visit (HOSPITAL_COMMUNITY)
Admission: RE | Admit: 2018-07-12 | Discharge: 2018-07-12 | Disposition: A | Discharge status: Home / Self Care | Payer: Commercial Managed Care - PPO | Attending: Urology | Admitting: Urology

## 2018-07-12 ENCOUNTER — Encounter (HOSPITAL_COMMUNITY)
Admission: RE | Disposition: A | Discharge status: Home / Self Care | Payer: Self-pay | Source: Home / Self Care | Attending: Urology

## 2018-07-12 ENCOUNTER — Encounter (HOSPITAL_COMMUNITY): Payer: Self-pay

## 2018-07-12 DIAGNOSIS — Z7984 Long term (current) use of oral hypoglycemic drugs: Secondary | ICD-10-CM | POA: Diagnosis not present

## 2018-07-12 DIAGNOSIS — C678 Malignant neoplasm of overlapping sites of bladder: Secondary | ICD-10-CM | POA: Diagnosis not present

## 2018-07-12 DIAGNOSIS — Z87891 Personal history of nicotine dependence: Secondary | ICD-10-CM | POA: Diagnosis not present

## 2018-07-12 DIAGNOSIS — Z79899 Other long term (current) drug therapy: Secondary | ICD-10-CM | POA: Insufficient documentation

## 2018-07-12 DIAGNOSIS — E119 Type 2 diabetes mellitus without complications: Secondary | ICD-10-CM | POA: Diagnosis not present

## 2018-07-12 DIAGNOSIS — I10 Essential (primary) hypertension: Secondary | ICD-10-CM | POA: Diagnosis not present

## 2018-07-12 HISTORY — PX: TRANSURETHRAL RESECTION OF BLADDER TUMOR: SHX2575

## 2018-07-12 HISTORY — PX: CYSTOSCOPY WITH RETROGRADE PYELOGRAM, URETEROSCOPY AND STENT PLACEMENT: SHX5789

## 2018-07-12 LAB — CBC
HCT: 41 % (ref 36.0–46.0)
HEMOGLOBIN: 12.8 g/dL (ref 12.0–15.0)
MCH: 31.4 pg (ref 26.0–34.0)
MCHC: 31.2 g/dL (ref 30.0–36.0)
MCV: 100.5 fL — ABNORMAL HIGH (ref 80.0–100.0)
Platelets: 376 10*3/uL (ref 150–400)
RBC: 4.08 MIL/uL (ref 3.87–5.11)
RDW: 11.9 % (ref 11.5–15.5)
WBC: 14.7 10*3/uL — ABNORMAL HIGH (ref 4.0–10.5)
nRBC: 0 % (ref 0.0–0.2)

## 2018-07-12 LAB — BASIC METABOLIC PANEL WITH GFR
Anion gap: 8 (ref 5–15)
BUN: 19 mg/dL (ref 6–20)
CO2: 24 mmol/L (ref 22–32)
Calcium: 8.6 mg/dL — ABNORMAL LOW (ref 8.9–10.3)
Chloride: 105 mmol/L (ref 98–111)
Creatinine, Ser: 1.04 mg/dL — ABNORMAL HIGH (ref 0.44–1.00)
GFR calc Af Amer: >60 mL/min (ref >60)
GFR calc non Af Amer: 60 mL/min — ABNORMAL LOW (ref >60)
Glucose, Bld: 125 mg/dL — ABNORMAL HIGH (ref 70–99)
Potassium: 4 mmol/L (ref 3.5–5.1)
Sodium: 137 mmol/L (ref 135–145)

## 2018-07-12 LAB — GLUCOSE, CAPILLARY
Glucose-Capillary: 115 mg/dL — ABNORMAL HIGH (ref 70–99)
Glucose-Capillary: 121 mg/dL — ABNORMAL HIGH (ref 70–99)

## 2018-07-12 SURGERY — TURBT (TRANSURETHRAL RESECTION OF BLADDER TUMOR)
Anesthesia: General | Laterality: Right

## 2018-07-12 MED ORDER — SODIUM CHLORIDE 0.9 % IR SOLN
Status: DC | PRN
  Administered 2018-07-12: 6000 mL via INTRAVESICAL

## 2018-07-12 MED ORDER — BELLADONNA ALKALOIDS-OPIUM 16.2-30 MG RE SUPP
RECTAL | Status: AC
  Filled 2018-07-12: qty 1

## 2018-07-12 MED ORDER — CIPROFLOXACIN HCL 500 MG PO TABS: 500.0000 mg | ORAL_TABLET | Freq: Once | ORAL | 0 refills | Status: AC

## 2018-07-12 MED ORDER — OXYCODONE HCL 5 MG/5ML PO SOLN
5.0000 mg | Freq: Once | ORAL | Status: DC | PRN
  Filled 2018-07-12: qty 5

## 2018-07-12 MED ORDER — FENTANYL CITRATE (PF) 250 MCG/5ML IJ SOLN
INTRAMUSCULAR | Status: AC
  Filled 2018-07-12: qty 5

## 2018-07-12 MED ORDER — FENTANYL CITRATE (PF) 250 MCG/5ML IJ SOLN
INTRAMUSCULAR | Status: DC | PRN
  Administered 2018-07-12: 100 ug via INTRAVENOUS
  Administered 2018-07-12 (×3): 50 ug via INTRAVENOUS

## 2018-07-12 MED ORDER — LABETALOL HCL 5 MG/ML IV SOLN
INTRAVENOUS | Status: AC
  Filled 2018-07-12: qty 4

## 2018-07-12 MED ORDER — MIDAZOLAM HCL 5 MG/5ML IJ SOLN
INTRAMUSCULAR | Status: DC | PRN
  Administered 2018-07-12: 2 mg via INTRAVENOUS

## 2018-07-12 MED ORDER — ACETAMINOPHEN 10 MG/ML IV SOLN
INTRAVENOUS | Status: AC
  Filled 2018-07-12: qty 100

## 2018-07-12 MED ORDER — LACTATED RINGERS IV SOLN
INTRAVENOUS | Status: DC
  Administered 2018-07-12 (×2): via INTRAVENOUS

## 2018-07-12 MED ORDER — PROPOFOL 10 MG/ML IV BOLUS
INTRAVENOUS | Status: DC | PRN
  Administered 2018-07-12: 130 mg via INTRAVENOUS

## 2018-07-12 MED ORDER — LIDOCAINE HCL URETHRAL/MUCOSAL 2 % EX GEL
CUTANEOUS | Status: AC
  Filled 2018-07-12: qty 5

## 2018-07-12 MED ORDER — ONDANSETRON HCL 4 MG/2ML IJ SOLN
INTRAMUSCULAR | Status: DC | PRN
  Administered 2018-07-12: 4 mg via INTRAVENOUS

## 2018-07-12 MED ORDER — DEXAMETHASONE SODIUM PHOSPHATE 10 MG/ML IJ SOLN
INTRAMUSCULAR | Status: DC | PRN
  Administered 2018-07-12: 10 mg via INTRAVENOUS

## 2018-07-12 MED ORDER — ROCURONIUM BROMIDE 100 MG/10ML IV SOLN
INTRAVENOUS | Status: DC | PRN
  Administered 2018-07-12: 20 mg via INTRAVENOUS
  Administered 2018-07-12: 10 mg via INTRAVENOUS
  Administered 2018-07-12: 20 mg via INTRAVENOUS
  Administered 2018-07-12: 30 mg via INTRAVENOUS

## 2018-07-12 MED ORDER — HYDROMORPHONE HCL 1 MG/ML IJ SOLN: 0.2500 mg | INTRAMUSCULAR | Status: DC | PRN

## 2018-07-12 MED ORDER — SUGAMMADEX SODIUM 200 MG/2ML IV SOLN
INTRAVENOUS | Status: DC | PRN
  Administered 2018-07-12: 200 mg via INTRAVENOUS

## 2018-07-12 MED ORDER — PROPOFOL 10 MG/ML IV BOLUS
INTRAVENOUS | Status: AC
  Filled 2018-07-12: qty 40

## 2018-07-12 MED ORDER — CEFAZOLIN SODIUM-DEXTROSE 2-4 GM/100ML-% IV SOLN
2.0000 g | INTRAVENOUS | Status: AC
  Administered 2018-07-12: 2 g via INTRAVENOUS
  Filled 2018-07-12: qty 100

## 2018-07-12 MED ORDER — DEXAMETHASONE SODIUM PHOSPHATE 10 MG/ML IJ SOLN
INTRAMUSCULAR | Status: AC
  Filled 2018-07-12: qty 1

## 2018-07-12 MED ORDER — ROCURONIUM BROMIDE 100 MG/10ML IV SOLN
INTRAVENOUS | Status: AC
  Filled 2018-07-12: qty 1

## 2018-07-12 MED ORDER — PROMETHAZINE HCL 25 MG/ML IJ SOLN: 6.2500 mg | INTRAMUSCULAR | Status: DC | PRN

## 2018-07-12 MED ORDER — OXYCODONE HCL 5 MG PO TABS: 5.0000 mg | ORAL_TABLET | Freq: Once | ORAL | Status: DC | PRN

## 2018-07-12 MED ORDER — KETOROLAC TROMETHAMINE 30 MG/ML IJ SOLN
INTRAMUSCULAR | Status: DC | PRN
  Administered 2018-07-12: 30 mg via INTRAVENOUS

## 2018-07-12 MED ORDER — LIDOCAINE 2% (20 MG/ML) 5 ML SYRINGE
INTRAMUSCULAR | Status: AC
  Filled 2018-07-12: qty 5

## 2018-07-12 MED ORDER — ACETAMINOPHEN 10 MG/ML IV SOLN
INTRAVENOUS | Status: DC | PRN
  Administered 2018-07-12: 1000 mg via INTRAVENOUS

## 2018-07-12 MED ORDER — LABETALOL HCL 5 MG/ML IV SOLN
INTRAVENOUS | Status: DC | PRN
  Administered 2018-07-12: 5 mg via INTRAVENOUS

## 2018-07-12 MED ORDER — ONDANSETRON HCL 4 MG/2ML IJ SOLN
INTRAMUSCULAR | Status: AC
  Filled 2018-07-12: qty 2

## 2018-07-12 MED ORDER — LIDOCAINE 2% (20 MG/ML) 5 ML SYRINGE
INTRAMUSCULAR | Status: DC | PRN
  Administered 2018-07-12: 60 mg via INTRAVENOUS

## 2018-07-12 MED ORDER — MIDAZOLAM HCL 2 MG/2ML IJ SOLN
INTRAMUSCULAR | Status: AC
  Filled 2018-07-12: qty 2

## 2018-07-12 MED ORDER — SCOPOLAMINE 1 MG/3DAYS TD PT72
1.0000 | MEDICATED_PATCH | TRANSDERMAL | Status: DC
  Administered 2018-07-12: 1 via TRANSDERMAL
  Filled 2018-07-12: qty 1

## 2018-07-12 MED ORDER — SODIUM CHLORIDE 0.9 % IV SOLN
INTRAVENOUS | Status: DC | PRN
  Administered 2018-07-12: 50 mL

## 2018-07-12 SURGICAL SUPPLY — 23 items
BAG URO CATCHER STRL LF (MISCELLANEOUS) ×3 IMPLANT
BASKET ZERO TIP NITINOL 2.4FR (BASKET) ×1 IMPLANT
CATH URET 5FR 28IN OPEN ENDED (CATHETERS) ×3 IMPLANT
CLOTH BEACON ORANGE TIMEOUT ST (SAFETY) ×3 IMPLANT
COVER WAND RF STERILE (DRAPES) IMPLANT
ELECT REM PT RETURN 15FT ADLT (MISCELLANEOUS) ×2 IMPLANT
EXTRACTOR STONE 1.7FRX115CM (UROLOGICAL SUPPLIES) IMPLANT
GLOVE BIOGEL M STRL SZ7.5 (GLOVE) ×3 IMPLANT
GOWN STRL REUS W/TWL XL LVL3 (GOWN DISPOSABLE) ×3 IMPLANT
GUIDEWIRE ANG ZIPWIRE 038X150 (WIRE) IMPLANT
GUIDEWIRE STR DUAL SENSOR (WIRE) ×3 IMPLANT
LOOP CUT BIPOLAR 24F LRG (ELECTROSURGICAL) ×1 IMPLANT
MANIFOLD NEPTUNE II (INSTRUMENTS) ×3 IMPLANT
NDL SAFETY ECLIPSE 18X1.5 (NEEDLE) ×2 IMPLANT
NEEDLE HYPO 18GX1.5 SHARP (NEEDLE) ×1
PACK CYSTO (CUSTOM PROCEDURE TRAY) ×3 IMPLANT
SHEATH URETERAL 12FRX28CM (UROLOGICAL SUPPLIES) IMPLANT
SHEATH URETERAL 12FRX35CM (MISCELLANEOUS) IMPLANT
STENT URET 6FRX26 CONTOUR (STENTS) ×1 IMPLANT
SYRINGE IRR TOOMEY STRL 70CC (SYRINGE) IMPLANT
TUBING CONNECTING 10 (TUBING) ×3 IMPLANT
TUBING UROLOGY SET (TUBING) ×3 IMPLANT
WIRE COONS/BENSON .038X145CM (WIRE) IMPLANT

## 2018-07-12 NOTE — Op Note (Signed)
Preoperative diagnosis:  1. High-grade non-muscle invasive bladder cancer  Postoperative diagnosis:  1. Same  Procedure: 1. Transurethral resection of bladder tumor, largest tumor measured 2 cm, 3 cm in total aggregate 2. Right retrograde pyelogram with interpretation 3. Right diagnostic ureteroscopy 4. Right renal pelvis stone removal 5. Right ureteral stent placement  Surgeon: Ardis Hughs, MD  Anesthesia: General  Complications: None  Intraoperative findings: 1.:  The right retrograde pyelogram was performed using 10 cc of Omnipaque contrast through a 5 [place] open-ended ureteral catheter and demonstrated normal caliber ureter with no significant filling defects or hydroureteronephrosis. #2: The diagnostic ureteroscopy demonstrated no significant abnormalities or areas of concern along the ureter or within the renal pelvis and calyces.  There was a small 2 mm stone in the upper pole which was free-floating I was able to easily grasped and removed. #3: There were approximately 6 remaining papillary tumors within the patient's bladder, the largest one was 2 cm.  These were located on the bladder dome, lateral wall (right) and posterior wall.  They were all removed completely. #4: There were no additional tumors remaining in the patient's bladder once I resected the remaining tumors.  I did re-resect a few areas that had been previously resected at the first surgery.  EBL: Minimal  Specimens: 1.:  Bladder tumors 2.:  Right upper pole kidney stone  Indication: Joan Ramirez is a 58 y.o. 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  She presented 2 weeks prior and we resected the majority of these tumors, but presents today for further evaluation of the right collecting system as well as completion of the staged TURBT.  After reviewing the management options for  treatment, she elected to proceed with the above surgical procedure(s). We have discussed the potential benefits and risks of the procedure, side effects of the proposed treatment, the likelihood of the patient achieving the goals of the procedure, and any potential problems that might occur during the procedure or recuperation. Informed consent has been obtained.  Description of procedure:  The patient was taken to the operating room and general anesthesia was induced.  The patient was placed in the dorsal lithotomy position, prepped and draped in the usual sterile fashion, and preoperative antibiotics were administered. A preoperative time-out was performed.   21 French 30 degrees cystoscope was gently placed into the patient's bladder under visual inspection.  The scope was then rotated and 3 in surgery fashion noting many areas of tumor or scar necrosis from previously resected areas as well as the mentioned tumors in the findings section.  I then used a 5 [place] open-ended catheter and performed a right retrograde pyelogram with the above findings.  I then advanced a 0.038 wire up into the right renal pelvis under fluoroscopic guidance removing the cystoscope over the wire.  I then advanced a semirigid ureteroscope up into the right ureteral orifice and up to the proximal ureter under visual guidance noting no significant ureteral abnormalities.  I slowly backed out the scope inspecting the ureter with no additional findings.  I then exchanged the semirigid scope for the flexible ureteroscope and was able to easily navigate up into the right renal pelvis.  Pyeloscopy was then performed using contrast and fluoroscopy as a guidance.  All poles were inspected and all calyces noted to be clear of any tumor.  There was a small stone in the right  upper pole which I was able to easily basket and remove without laser fragmentation.  The scope was removed and the wire was left remaining.  I then exchanged the  flexible ureteroscope for a 26 French resectoscope sheath that was passed and using the visual obturator and the 30 degree cystoscope.  I then exchanged the obturator for the resection loop element and proceeded with a transurethral resection of her bladder tumor.  Once all the bladder tumors had been completely resected I fulgurated the bases and hemostasis was noted to be excellent.  I then re-resected several areas on the patient's left lateral wall her previous tumors had been resected 10 days prior.  I then irrigated out all the bladder tumor specimen and sent as to the pathology department for permanent section.  I then backloaded the wire through the 21 [place] 30 degree cystoscope and advanced a 26 cm x 6 French double-J ureteral stent up into the right renal pelvis under fluoroscopy.  Once the stent was noted to be within the renal pelvis the wire was slowly backed out as simultaneously advancing the stent to the bladder neck.  Once the stent was within the bladder the wire was removed.  Stent tether was brought out through the patient's urethra and tucked into her vagina.  Reinspection of the bladder noted a excellent curl within the bladder.  Fluoroscopy confirmed good position of the proximal end of the stent within the renal pelvis.  Urethral lidocaine jelly was then instilled into the patient's urethra and a B&O suppository into her rectum.  She was subsequently extubated return the PACU in excellent condition.  Disposition: The patient will be instructed to remove her stent on Monday, Jan. 20th.  Ardis Hughs, M.D.

## 2018-07-12 NOTE — Interval H&P Note (Signed)
History and Physical Interval Note:  Patient returns for re-resection of her bladder tumor.  She had a large burden of disease that was incompletely resected.  She has had some bleeding but otherwise feels well and is ready to proceed.  07/12/2018 7:14 AM  Joan Ramirez  has presented today for surgery, with the diagnosis of BLADDER CANCER  The various methods of treatment have been discussed with the patient and family. After consideration of risks, benefits and other options for treatment, the patient has consented to  Procedure(s): TRANSURETHRAL RESECTION OF BLADDER TUMOR (TURBT) (N/A) RIGHT  RETROGRADE PYELOGRAM, URETEROSCOPY AND STENT PLACEMENT (Right) as a surgical intervention .  The patient's history has been reviewed, patient examined, no change in status, stable for surgery.  I have reviewed the patient's chart and labs.  Questions were answered to the patient's satisfaction.     Ardis Hughs

## 2018-07-12 NOTE — Anesthesia Postprocedure Evaluation (Signed)
Anesthesia Post Note  Patient: Joan Ramirez  Procedure(s) Performed: TRANSURETHRAL RESECTION OF BLADDER TUMOR (TURBT) (N/A ) RIGHT  RETROGRADE PYELOGRAM, URETEROSCOPY AND STENT PLACEMENT (Right )     Patient location during evaluation: PACU Anesthesia Type: General Level of consciousness: awake and alert Pain management: pain level controlled Vital Signs Assessment: post-procedure vital signs reviewed and stable Respiratory status: spontaneous breathing, nonlabored ventilation, respiratory function stable and patient connected to nasal cannula oxygen Cardiovascular status: blood pressure returned to baseline and stable Postop Assessment: no apparent nausea or vomiting Anesthetic complications: no    Last Vitals:  Vitals:   07/12/18 1030 07/12/18 1119  BP: (!) 159/116 134/60  Pulse: 73 61  Resp: (!) 23 16  Temp: 36.6 C 36.9 C  SpO2: 97% 97%    Last Pain:  Vitals:   07/12/18 1119  TempSrc:   PainSc: 0-No pain                 Ryan P Ellender

## 2018-07-12 NOTE — Transfer of Care (Signed)
Immediate Anesthesia Transfer of Care Note  Patient: Joan Ramirez  Procedure(s) Performed: TRANSURETHRAL RESECTION OF BLADDER TUMOR (TURBT) (N/A ) RIGHT  RETROGRADE PYELOGRAM, URETEROSCOPY AND STENT PLACEMENT (Right )  Patient Location: PACU  Anesthesia Type:General  Level of Consciousness: awake, alert  and patient cooperative  Airway & Oxygen Therapy: Patient Spontanous Breathing and Patient connected to face mask oxygen  Post-op Assessment: Report given to RN and Post -op Vital signs reviewed and stable  Post vital signs: stable  Last Vitals:  Vitals Value Taken Time  BP 159/76 07/12/2018 10:00 AM  Temp    Pulse 73 07/12/2018 10:02 AM  Resp 17 07/12/2018 10:02 AM  SpO2 100 % 07/12/2018 10:02 AM  Vitals shown include unvalidated device data.  Last Pain:  Vitals:   07/12/18 0640  TempSrc:   PainSc: 0-No pain         Complications: No apparent anesthesia complications

## 2018-07-12 NOTE — Discharge Instructions (Signed)
DISCHARGE INSTRUCTIONS FOR KIDNEY STONE/URETERAL STENT   MEDICATIONS:  1. Resume all your other meds from home - except do not take any extra narcotic pain meds that you may have at home.  2. Pyridium is to help with the burning/stinging when you urinate. 3. Tramadol is for moderate/severe pain, otherwise taking upto 1000 mg every 6 hours of plainTylenol will help treat your pain.   4. Take Cipro one hour prior to removal of your stent.   ACTIVITY:  1. No strenuous activity x 1week  2. No driving while on narcotic pain medications  3. Drink plenty of water  4. Continue to walk at home - you can still get blood clots when you are at home, so keep active, but don't over do it.  5. May return to work/school tomorrow or when you feel ready   BATHING:  1. You can shower and we recommend daily showers  2. You have a string coming from your urethra: The stent string is attached to your ureteral stent. Do not pull on this.   SIGNS/SYMPTOMS TO CALL:  Please call us if you have a fever greater than 101.5, uncontrolled nausea/vomiting, uncontrolled pain, dizziness, unable to urinate, bloody urine, chest pain, shortness of breath, leg swelling, leg pain, redness around wound, drainage from wound, or any other concerns or questions.   You can reach [place] at [phone].   FOLLOW-UP:  1. You have an appointment for stent removal in 2 weeks with Dr. Louis Meckel for further discussion of next steps in management of your bladder cancer. 2. You have a string attached to your stent, you may remove it on Monday, Jan 20th. To do this, pull the strings until the stents are completely removed. You may feel an odd sensation in your back.   Transurethral Resection of Bladder Tumor (TURBT) or Bladder Biopsy   Definition:  Transurethral Resection of the Bladder Tumor is a surgical procedure used to diagnose and remove tumors within the bladder. TURBT is the most common treatment for early stage bladder  cancer.  General instructions:     Your recent bladder surgery requires very little post hospital care but some definite precautions.  Despite the fact that no skin incisions were used, the area around the bladder incisions are raw and covered with scabs to promote healing and prevent bleeding. Certain precautions are needed to insure that the scabs are not disturbed over the next 2-4 weeks while the healing proceeds.  Because the raw surface inside your bladder and the irritating effects of urine you may expect frequency of urination and/or urgency (a stronger desire to urinate) and perhaps even getting up at night more often. This will usually resolve or improve slowly over the healing period. You may see some blood in your urine over the first 6 weeks. Do not be alarmed, even if the urine was clear for a while. Get off your feet and drink lots of fluids until clearing occurs. If you start to pass clots or don't improve call us.  Diet:  You may return to your normal diet immediately. Because of the raw surface of your bladder, alcohol, spicy foods, foods high in acid and drinks with caffeine may cause irritation or frequency and should be used in moderation. To keep your urine flowing freely and avoid constipation, drink plenty of fluids during the day (8-10 glasses). Tip: Avoid cranberry juice because it is very acidic.  Activity:  Your physical activity doesn't need to be restricted. However, if you are  very active, you may see some blood in the urine. We suggest that you reduce your activity under the circumstances until the bleeding has stopped.  Bowels:  It is important to keep your bowels regular during the postoperative period. Straining with bowel movements can cause bleeding. A bowel movement every other day is reasonable. Use a mild laxative if needed, such as milk of magnesia 2-3 tablespoons, or 2 Dulcolax tablets. Call if you continue to have problems. If you had been taking  narcotics for pain, before, during or after your surgery, you may be constipated. Take a laxative if necessary.    Medication:  You should resume your pre-surgery medications unless told not to. In addition you may be given an antibiotic to prevent or treat infection. Antibiotics are not always necessary. All medication should be taken as prescribed until the bottles are finished unless you are having an unusual reaction to one of the drugs.

## 2018-07-12 NOTE — Anesthesia Procedure Notes (Signed)
Procedure Name: Intubation Date/Time: 07/12/2018 8:18 AM Performed by: Pilar Grammes, CRNA Pre-anesthesia Checklist: Patient identified, Emergency Drugs available, Suction available, Patient being monitored and Timeout performed Patient Re-evaluated:Patient Re-evaluated prior to induction Oxygen Delivery Method: Circle system utilized Preoxygenation: Pre-oxygenation with 100% oxygen Induction Type: IV induction Ventilation: Mask ventilation without difficulty Laryngoscope Size: Miller, 3 and 2 Grade View: Grade II Tube type: Oral Tube size: 7.0 mm Number of attempts: 1 Airway Equipment and Method: Stylet Placement Confirmation: positive ETCO2,  ETT inserted through vocal cords under direct vision,  CO2 detector and breath sounds checked- equal and bilateral Secured at: 22 cm Tube secured with: Tape Dental Injury: Teeth and Oropharynx as per pre-operative assessment

## 2018-07-13 ENCOUNTER — Encounter (HOSPITAL_COMMUNITY): Payer: Self-pay | Admitting: Urology

## 2019-01-22 ENCOUNTER — Other Ambulatory Visit: Payer: Self-pay | Admitting: Urology

## 2019-02-02 ENCOUNTER — Other Ambulatory Visit (HOSPITAL_COMMUNITY)
Admission: RE | Admit: 2019-02-02 | Discharge: 2019-02-02 | Disposition: A | Discharge status: Home / Self Care | Payer: Commercial Managed Care - PPO | Source: Ambulatory Visit | Attending: Urology | Admitting: Urology

## 2019-02-02 DIAGNOSIS — Z20828 Contact with and (suspected) exposure to other viral communicable diseases: Secondary | ICD-10-CM | POA: Diagnosis not present

## 2019-02-02 DIAGNOSIS — Z01812 Encounter for preprocedural laboratory examination: Secondary | ICD-10-CM | POA: Insufficient documentation

## 2019-02-02 DIAGNOSIS — C679 Malignant neoplasm of bladder, unspecified: Secondary | ICD-10-CM | POA: Diagnosis not present

## 2019-02-03 LAB — SARS CORONAVIRUS 2 (TAT 6-24 HRS): SARS Coronavirus 2: NEGATIVE

## 2019-02-04 ENCOUNTER — Encounter (HOSPITAL_BASED_OUTPATIENT_CLINIC_OR_DEPARTMENT_OTHER): Payer: Self-pay | Admitting: *Deleted

## 2019-02-04 ENCOUNTER — Other Ambulatory Visit: Payer: Self-pay

## 2019-02-04 NOTE — Progress Notes (Signed)
Spoke with patient via telephone for pre op interview. NPO after MN. Patient to take wellbutrin AM of surgery with a sip of water. Arrival time 0800.

## 2019-02-06 ENCOUNTER — Encounter (HOSPITAL_BASED_OUTPATIENT_CLINIC_OR_DEPARTMENT_OTHER): Payer: Self-pay | Admitting: *Deleted

## 2019-02-06 ENCOUNTER — Ambulatory Visit (HOSPITAL_BASED_OUTPATIENT_CLINIC_OR_DEPARTMENT_OTHER): Payer: Commercial Managed Care - PPO | Admitting: Certified Registered"

## 2019-02-06 ENCOUNTER — Ambulatory Visit (HOSPITAL_BASED_OUTPATIENT_CLINIC_OR_DEPARTMENT_OTHER)
Admission: RE | Admit: 2019-02-06 | Discharge: 2019-02-06 | Disposition: A | Discharge status: Home / Self Care | Payer: Commercial Managed Care - PPO | Attending: Urology | Admitting: Urology

## 2019-02-06 ENCOUNTER — Other Ambulatory Visit: Payer: Self-pay

## 2019-02-06 ENCOUNTER — Encounter (HOSPITAL_BASED_OUTPATIENT_CLINIC_OR_DEPARTMENT_OTHER)
Admission: RE | Disposition: A | Discharge status: Home / Self Care | Payer: Self-pay | Source: Home / Self Care | Attending: Urology

## 2019-02-06 DIAGNOSIS — Z87891 Personal history of nicotine dependence: Secondary | ICD-10-CM | POA: Diagnosis not present

## 2019-02-06 DIAGNOSIS — J45909 Unspecified asthma, uncomplicated: Secondary | ICD-10-CM | POA: Diagnosis not present

## 2019-02-06 DIAGNOSIS — E119 Type 2 diabetes mellitus without complications: Secondary | ICD-10-CM | POA: Insufficient documentation

## 2019-02-06 DIAGNOSIS — Z79899 Other long term (current) drug therapy: Secondary | ICD-10-CM | POA: Insufficient documentation

## 2019-02-06 DIAGNOSIS — Z9221 Personal history of antineoplastic chemotherapy: Secondary | ICD-10-CM | POA: Insufficient documentation

## 2019-02-06 DIAGNOSIS — Z7984 Long term (current) use of oral hypoglycemic drugs: Secondary | ICD-10-CM | POA: Insufficient documentation

## 2019-02-06 DIAGNOSIS — I10 Essential (primary) hypertension: Secondary | ICD-10-CM | POA: Diagnosis not present

## 2019-02-06 DIAGNOSIS — C678 Malignant neoplasm of overlapping sites of bladder: Secondary | ICD-10-CM

## 2019-02-06 DIAGNOSIS — C679 Malignant neoplasm of bladder, unspecified: Secondary | ICD-10-CM | POA: Insufficient documentation

## 2019-02-06 HISTORY — PX: CYSTOSCOPY W/ RETROGRADES: SHX1426

## 2019-02-06 HISTORY — PX: TRANSURETHRAL RESECTION OF BLADDER TUMOR: SHX2575

## 2019-02-06 LAB — POCT I-STAT, CHEM 8
BUN: 27 mg/dL — ABNORMAL HIGH (ref 6–20)
Calcium, Ion: 1.24 mmol/L (ref 1.15–1.40)
Chloride: 106 mmol/L (ref 98–111)
Creatinine, Ser: 1.2 mg/dL — ABNORMAL HIGH (ref 0.44–1.00)
Glucose, Bld: 154 mg/dL — ABNORMAL HIGH (ref 70–99)
HCT: 47 % — ABNORMAL HIGH (ref 36.0–46.0)
Hemoglobin: 16 g/dL — ABNORMAL HIGH (ref 12.0–15.0)
Potassium: 4.1 mmol/L (ref 3.5–5.1)
Sodium: 140 mmol/L (ref 135–145)
TCO2: 23 mmol/L (ref 22–32)

## 2019-02-06 SURGERY — TURBT (TRANSURETHRAL RESECTION OF BLADDER TUMOR)
Anesthesia: General | Site: Bladder | Laterality: Right

## 2019-02-06 MED ORDER — BELLADONNA ALKALOIDS-OPIUM 16.2-60 MG RE SUPP
RECTAL | Status: DC | PRN
  Administered 2019-02-06: 1 via RECTAL

## 2019-02-06 MED ORDER — ACETAMINOPHEN 160 MG/5ML PO SOLN
325.0000 mg | ORAL | Status: DC | PRN
  Filled 2019-02-06: qty 20.3

## 2019-02-06 MED ORDER — LIDOCAINE 2% (20 MG/ML) 5 ML SYRINGE
INTRAMUSCULAR | Status: AC
  Filled 2019-02-06: qty 5

## 2019-02-06 MED ORDER — CEFAZOLIN SODIUM-DEXTROSE 2-4 GM/100ML-% IV SOLN
2.0000 g | INTRAVENOUS | Status: AC
  Administered 2019-02-06 (×2): 2 g via INTRAVENOUS
  Filled 2019-02-06: qty 100

## 2019-02-06 MED ORDER — KETOROLAC TROMETHAMINE 30 MG/ML IJ SOLN
INTRAMUSCULAR | Status: DC | PRN
  Administered 2019-02-06: 30 mg via INTRAVENOUS

## 2019-02-06 MED ORDER — BELLADONNA ALKALOIDS-OPIUM 16.2-60 MG RE SUPP
RECTAL | Status: AC
  Filled 2019-02-06: qty 1

## 2019-02-06 MED ORDER — KETOROLAC TROMETHAMINE 30 MG/ML IJ SOLN
INTRAMUSCULAR | Status: AC
  Filled 2019-02-06: qty 1

## 2019-02-06 MED ORDER — ONDANSETRON HCL 4 MG/2ML IJ SOLN
INTRAMUSCULAR | Status: AC
  Filled 2019-02-06: qty 2

## 2019-02-06 MED ORDER — FENTANYL CITRATE (PF) 100 MCG/2ML IJ SOLN
25.0000 ug | INTRAMUSCULAR | Status: DC | PRN
  Filled 2019-02-06: qty 1

## 2019-02-06 MED ORDER — ACETAMINOPHEN 10 MG/ML IV SOLN
1000.0000 mg | Freq: Once | INTRAVENOUS | Status: DC | PRN
  Filled 2019-02-06: qty 100

## 2019-02-06 MED ORDER — TRAMADOL HCL 50 MG PO TABS: 50.0000 mg | ORAL_TABLET | Freq: Four times a day (QID) | ORAL | 0 refills | Status: DC | PRN

## 2019-02-06 MED ORDER — ROCURONIUM BROMIDE 10 MG/ML (PF) SYRINGE
PREFILLED_SYRINGE | INTRAVENOUS | Status: AC
  Filled 2019-02-06: qty 10

## 2019-02-06 MED ORDER — IOHEXOL 300 MG/ML  SOLN
INTRAMUSCULAR | Status: DC | PRN
  Administered 2019-02-06: 10 mL

## 2019-02-06 MED ORDER — MEPERIDINE HCL 25 MG/ML IJ SOLN
6.2500 mg | INTRAMUSCULAR | Status: DC | PRN
  Filled 2019-02-06: qty 1

## 2019-02-06 MED ORDER — PHENAZOPYRIDINE HCL 200 MG PO TABS: 200.0000 mg | ORAL_TABLET | Freq: Three times a day (TID) | ORAL | 0 refills | Status: DC | PRN

## 2019-02-06 MED ORDER — FENTANYL CITRATE (PF) 100 MCG/2ML IJ SOLN
INTRAMUSCULAR | Status: DC | PRN
  Administered 2019-02-06 (×2): 25 ug via INTRAVENOUS
  Administered 2019-02-06: 50 ug via INTRAVENOUS
  Administered 2019-02-06 (×2): 25 ug via INTRAVENOUS

## 2019-02-06 MED ORDER — OXYCODONE HCL 5 MG PO TABS
5.0000 mg | ORAL_TABLET | Freq: Once | ORAL | Status: DC | PRN
  Filled 2019-02-06: qty 1

## 2019-02-06 MED ORDER — PROPOFOL 10 MG/ML IV BOLUS
INTRAVENOUS | Status: AC
  Filled 2019-02-06: qty 20

## 2019-02-06 MED ORDER — FENTANYL CITRATE (PF) 100 MCG/2ML IJ SOLN
INTRAMUSCULAR | Status: AC
  Filled 2019-02-06: qty 2

## 2019-02-06 MED ORDER — MIDAZOLAM HCL 2 MG/2ML IJ SOLN
INTRAMUSCULAR | Status: DC | PRN
  Administered 2019-02-06: 2 mg via INTRAVENOUS

## 2019-02-06 MED ORDER — MIDAZOLAM HCL 2 MG/2ML IJ SOLN
INTRAMUSCULAR | Status: AC
  Filled 2019-02-06: qty 2

## 2019-02-06 MED ORDER — SODIUM CHLORIDE 0.9 % IR SOLN
Status: DC | PRN
  Administered 2019-02-06 (×7): 6000 mL via INTRAVESICAL

## 2019-02-06 MED ORDER — CEFAZOLIN SODIUM-DEXTROSE 2-4 GM/100ML-% IV SOLN
INTRAVENOUS | Status: AC
  Filled 2019-02-06: qty 100

## 2019-02-06 MED ORDER — SCOPOLAMINE 1 MG/3DAYS TD PT72
MEDICATED_PATCH | TRANSDERMAL | Status: DC | PRN
  Administered 2019-02-06: 1 via TRANSDERMAL

## 2019-02-06 MED ORDER — ACETAMINOPHEN 325 MG PO TABS
325.0000 mg | ORAL_TABLET | ORAL | Status: DC | PRN
  Filled 2019-02-06: qty 2

## 2019-02-06 MED ORDER — PROMETHAZINE HCL 25 MG/ML IJ SOLN
6.2500 mg | INTRAMUSCULAR | Status: DC | PRN
  Filled 2019-02-06: qty 1

## 2019-02-06 MED ORDER — SCOPOLAMINE 1 MG/3DAYS TD PT72
MEDICATED_PATCH | TRANSDERMAL | Status: AC
  Filled 2019-02-06: qty 1

## 2019-02-06 MED ORDER — OXYCODONE HCL 5 MG/5ML PO SOLN
5.0000 mg | Freq: Once | ORAL | Status: DC | PRN
  Filled 2019-02-06: qty 5

## 2019-02-06 MED ORDER — LIDOCAINE 2% (20 MG/ML) 5 ML SYRINGE
INTRAMUSCULAR | Status: DC | PRN
  Administered 2019-02-06: 100 mg via INTRAVENOUS

## 2019-02-06 MED ORDER — LACTATED RINGERS IV SOLN
INTRAVENOUS | Status: DC
  Administered 2019-02-06 (×2): via INTRAVENOUS
  Filled 2019-02-06: qty 1000

## 2019-02-06 MED ORDER — DEXAMETHASONE SODIUM PHOSPHATE 10 MG/ML IJ SOLN
INTRAMUSCULAR | Status: DC | PRN
  Administered 2019-02-06: 5 mg via INTRAVENOUS

## 2019-02-06 MED ORDER — PROPOFOL 10 MG/ML IV BOLUS
INTRAVENOUS | Status: DC | PRN
  Administered 2019-02-06: 110 mg via INTRAVENOUS

## 2019-02-06 MED ORDER — DEXAMETHASONE SODIUM PHOSPHATE 10 MG/ML IJ SOLN
INTRAMUSCULAR | Status: AC
  Filled 2019-02-06: qty 1

## 2019-02-06 MED ORDER — ONDANSETRON HCL 4 MG/2ML IJ SOLN
INTRAMUSCULAR | Status: DC | PRN
  Administered 2019-02-06: 4 mg via INTRAVENOUS

## 2019-02-06 SURGICAL SUPPLY — 29 items
BAG DRAIN URO-CYSTO SKYTR STRL (DRAIN) ×3 IMPLANT
BAG URINE DRAINAGE (UROLOGICAL SUPPLIES) ×3 IMPLANT
BASKET LASER NITINOL 1.9FR (BASKET) IMPLANT
CATH FOLEY 3WAY 30CC 22FR (CATHETERS) ×3 IMPLANT
CATH URET 5FR 28IN OPEN ENDED (CATHETERS) ×3 IMPLANT
CATH URET DUAL LUMEN 6-10FR 50 (CATHETERS) IMPLANT
CLOTH BEACON ORANGE TIMEOUT ST (SAFETY) ×3 IMPLANT
ELECT REM PT RETURN 9FT ADLT (ELECTROSURGICAL)
ELECTRODE REM PT RTRN 9FT ADLT (ELECTROSURGICAL) IMPLANT
EVACUATOR MICROVAS BLADDER (UROLOGICAL SUPPLIES) IMPLANT
EXTRACTOR STONE 1.7FRX115CM (UROLOGICAL SUPPLIES) IMPLANT
FIBER LASER TRAC TIP (UROLOGICAL SUPPLIES) IMPLANT
GLOVE BIO SURGEON STRL SZ7.5 (GLOVE) ×3 IMPLANT
GOWN STRL REUS W/ TWL XL LVL3 (GOWN DISPOSABLE) ×4 IMPLANT
GOWN STRL REUS W/TWL XL LVL3 (GOWN DISPOSABLE) ×5 IMPLANT
GUIDEWIRE ANG ZIPWIRE 038X150 (WIRE) IMPLANT
GUIDEWIRE STR DUAL SENSOR (WIRE) ×3 IMPLANT
HOLDER FOLEY CATH W/STRAP (MISCELLANEOUS) IMPLANT
IV NS IRRIG 3000ML ARTHROMATIC (IV SOLUTION) ×12 IMPLANT
KIT TURNOVER CYSTO (KITS) ×3 IMPLANT
LOOP CUT BIPOLAR 24F LRG (ELECTROSURGICAL) ×3 IMPLANT
MANIFOLD NEPTUNE II (INSTRUMENTS) ×3 IMPLANT
NS IRRIG 500ML POUR BTL (IV SOLUTION) ×3 IMPLANT
PACK CYSTO (CUSTOM PROCEDURE TRAY) ×3 IMPLANT
SYR 30ML LL (SYRINGE) IMPLANT
SYRINGE IRR TOOMEY STRL 70CC (SYRINGE) ×3 IMPLANT
TUBE CONNECTING 12X1/4 (SUCTIONS) IMPLANT
TUBING UROLOGY SET (TUBING) ×3 IMPLANT
WATER STERILE IRR 500ML POUR (IV SOLUTION) ×3 IMPLANT

## 2019-02-06 NOTE — Anesthesia Procedure Notes (Signed)
Procedure Name: LMA Insertion Date/Time: 02/06/2019 10:39 AM Performed by: Suan Halter, CRNA Pre-anesthesia Checklist: Patient identified, Emergency Drugs available, Suction available and Patient being monitored Patient Re-evaluated:Patient Re-evaluated prior to induction Oxygen Delivery Method: Circle system utilized Preoxygenation: Pre-oxygenation with 100% oxygen Induction Type: IV induction Ventilation: Mask ventilation without difficulty LMA: LMA inserted LMA Size: 4.0 Number of attempts: 1 Airway Equipment and Method: Bite block Placement Confirmation: positive ETCO2 Tube secured with: Tape Dental Injury: Teeth and Oropharynx as per pre-operative assessment

## 2019-02-06 NOTE — Transfer of Care (Signed)
Immediate Anesthesia Transfer of Care Note  Patient: Joan Ramirez  Procedure(s) Performed: Procedure(s) (LRB): TRANSURETHRAL RESECTION OF BLADDER TUMOR (TURBT) (N/A) CYSTOSCOPY WITH RETROGRADE PYELOGRAM (Right)  Patient Location: PACU  Anesthesia Type: General  Level of Consciousness: awake, oriented, sedated and patient cooperative  Airway & Oxygen Therapy: Patient Spontanous Breathing and Patient connected to face mask oxygen  Post-op Assessment: Report given to PACU RN and Post -op Vital signs reviewed and stable  Post vital signs: Reviewed and stable  Complications: No apparent anesthesia complications Last Vitals:  Vitals Value Taken Time  BP 149/66 02/06/19 1315  Temp 36.7 C 02/06/19 1315  Pulse 63 02/06/19 1323  Resp 15 02/06/19 1322  SpO2 96 % 02/06/19 1323  Vitals shown include unvalidated device data.  Last Pain:  Vitals:   02/06/19 1300  TempSrc:   PainSc: 0-No pain      Patients Stated Pain Goal: 5 (02/06/19 9355)

## 2019-02-06 NOTE — Discharge Instructions (Signed)
Transurethral Resection of Bladder Tumor (TURBT) or Bladder Biopsy ° ° °Definition: ° Transurethral Resection of the Bladder Tumor is a surgical procedure used to diagnose and remove tumors within the bladder. TURBT is the most common treatment for early stage bladder cancer. ° °General instructions: °   ° Your recent bladder surgery requires very little post hospital care but some definite precautions. ° °Despite the fact that no skin incisions were used, the area around the bladder incisions are raw and covered with scabs to promote healing and prevent bleeding. Certain precautions are needed to insure that the scabs are not disturbed over the next 2-4 weeks while the healing proceeds. ° °Because the raw surface inside your bladder and the irritating effects of urine you may expect frequency of urination and/or urgency (a stronger desire to urinate) and perhaps even getting up at night more often. This will usually resolve or improve slowly over the healing period. You may see some blood in your urine over the first 6 weeks. Do not be alarmed, even if the urine was clear for a while. Get off your feet and drink lots of fluids until clearing occurs. If you start to pass clots or don't improve call us. ° °Diet: ° °You may return to your normal diet immediately. Because of the raw surface of your bladder, alcohol, spicy foods, foods high in acid and drinks with caffeine may cause irritation or frequency and should be used in moderation. To keep your urine flowing freely and avoid constipation, drink plenty of fluids during the day (8-10 glasses). Tip: Avoid cranberry juice because it is very acidic. ° °Activity: ° °Your physical activity doesn't need to be restricted. However, if you are very active, you may see some blood in the urine. We suggest that you reduce your activity under the circumstances until the bleeding has stopped. ° °Bowels: ° °It is important to keep your bowels regular during the postoperative  period. Straining with bowel movements can cause bleeding. A bowel movement every other day is reasonable. Use a mild laxative if needed, such as milk of magnesia 2-3 tablespoons, or 2 Dulcolax tablets. Call if you continue to have problems. If you had been taking narcotics for pain, before, during or after your surgery, you may be constipated. Take a laxative if necessary. ° ° ° °Medication: ° °You should resume your pre-surgery medications unless told not to. In addition you may be given an antibiotic to prevent or treat infection. Antibiotics are not always necessary. All medication should be taken as prescribed until the bottles are finished unless you are having an unusual reaction to one of the drugs. ° ° ° ° ° °Post Anesthesia Home Care Instructions ° °Activity: °Get plenty of rest for the remainder of the day. A responsible individual must stay with you for 24 hours following the procedure.  °For the next 24 hours, DO NOT: °-Drive a car °-Operate machinery °-Drink alcoholic beverages °-Take any medication unless instructed by your physician °-Make any legal decisions or sign important papers. ° °Meals: °Start with liquid foods such as gelatin or soup. Progress to regular foods as tolerated. Avoid greasy, spicy, heavy foods. If nausea and/or vomiting occur, drink only clear liquids until the nausea and/or vomiting subsides. Call your physician if vomiting continues. ° °Special Instructions/Symptoms: °Your throat may feel dry or sore from the anesthesia or the breathing tube placed in your throat during surgery. If this causes discomfort, gargle with warm salt water. The discomfort should disappear within 24   hours. ° °If you had a scopolamine patch placed behind your ear for the management of post- operative nausea and/or vomiting: ° °1. The medication in the patch is effective for 72 hours, after which it should be removed.  Wrap patch in a tissue and discard in the trash. Wash hands thoroughly with soap and  water. °2. You may remove the patch earlier than 72 hours if you experience unpleasant side effects which may include dry mouth, dizziness or visual disturbances. °3. Avoid touching the patch. Wash your hands with soap and water after contact with the patch. °   ° ° ° °

## 2019-02-06 NOTE — Anesthesia Postprocedure Evaluation (Signed)
Anesthesia Post Note  Patient: Joan Ramirez  Procedure(s) Performed: TRANSURETHRAL RESECTION OF BLADDER TUMOR (TURBT) (N/A Bladder) CYSTOSCOPY WITH RETROGRADE PYELOGRAM (Right )     Patient location during evaluation: PACU Anesthesia Type: General Level of consciousness: awake Pain management: pain level controlled Vital Signs Assessment: post-procedure vital signs reviewed and stable Respiratory status: spontaneous breathing Cardiovascular status: stable Postop Assessment: no apparent nausea or vomiting Anesthetic complications: no    Last Vitals:  Vitals:   02/06/19 1300 02/06/19 1315  BP: (!) 161/75 (!) 149/66  Pulse: 65 62  Resp: 15 14  Temp:  36.7 C  SpO2: 100% 97%    Last Pain:  Vitals:   02/06/19 1300  TempSrc:   PainSc: 0-No pain   Pain Goal: Patients Stated Pain Goal: 5 (02/06/19 0833)                 Huston Foley

## 2019-02-06 NOTE — Anesthesia Preprocedure Evaluation (Signed)
Anesthesia Evaluation  Patient identified by MRN, date of birth, ID band Patient awake    Reviewed: Allergy & Precautions, NPO status , Patient's Chart, lab work & pertinent test results  History of Anesthesia Complications (+) PONV and history of anesthetic complications  Airway Mallampati: II  TM Distance: >3 FB Neck ROM: Full    Dental no notable dental hx. (+) Teeth Intact   Pulmonary Current Smoker and Patient abstained from smoking., former smoker,    Pulmonary exam normal breath sounds clear to auscultation       Cardiovascular hypertension, Pt. on medications Normal cardiovascular exam Rhythm:Regular Rate:Normal  ECG: NSR, rate 88   Neuro/Psych PSYCHIATRIC DISORDERS Depression negative neurological ROS     GI/Hepatic negative GI ROS, Neg liver ROS,   Endo/Other  diabetes, Type 2, Oral Hypoglycemic Agents  Renal/GU      Musculoskeletal negative musculoskeletal ROS (+)   Abdominal (+) + obese,   Peds  Hematology negative hematology ROS (+)   Anesthesia Other Findings BLADDER CANCER  Reproductive/Obstetrics                             Anesthesia Physical  Anesthesia Plan  ASA: III  Anesthesia Plan: General   Post-op Pain Management:    Induction: Intravenous  PONV Risk Score and Plan: 4 or greater and Scopolamine patch - Pre-op, Midazolam, Dexamethasone, Ondansetron and Treatment may vary due to age or medical condition  Airway Management Planned: Oral ETT and LMA  Additional Equipment:   Intra-op Plan:   Post-operative Plan: Extubation in OR  Informed Consent: I have reviewed the patients History and Physical, chart, labs and discussed the procedure including the risks, benefits and alternatives for the proposed anesthesia with the patient or authorized representative who has indicated his/her understanding and acceptance.     Dental advisory given  Plan  Discussed with: CRNA  Anesthesia Plan Comments:         Anesthesia Quick Evaluation

## 2019-02-06 NOTE — Interval H&P Note (Signed)
History and Physical Interval Note:  02/06/2019 10:24 AM  Joan Ramirez  has presented today for surgery, with the diagnosis of RECURRENT BLADDER CANCER.  The various methods of treatment have been discussed with the patient and family. After consideration of risks, benefits and other options for treatment, the patient has consented to  Procedure(s): TRANSURETHRAL RESECTION OF BLADDER TUMOR (TURBT) (N/A) CYSTOSCOPY WITH RETROGRADE PYELOGRAM (Right) as a surgical intervention.  The patient's history has been reviewed, patient examined, no change in status, stable for surgery.  I have reviewed the patient's chart and labs.  Questions were answered to the patient's satisfaction.     Ardis Hughs

## 2019-02-06 NOTE — OR Nursing (Signed)
Iv removed in phase 2.  Angiocath intact.

## 2019-02-06 NOTE — Op Note (Signed)
Preoperative diagnosis:  1. Recurrent transitional cell carcinoma of the bladder  Postoperative diagnosis:  1. Same  Procedure: 1. Transurethral resection of bladder tumor, greater than 5 cm 2. Right retrograde pyelogram with interpretation  Surgeon: Ardis Hughs, MD  Anesthesia: General  Complications: None  Intraoperative findings:  #1: The right retrograde pyelogram was performed using 10 cc of Omnipaque contrast through a 5 [place] open-ended ureteral catheter and demonstrated a normal caliber ureter with no significant filling defects.  The renal pelvis was of normal caliber with no filling defects or abnormalities. #2: The patient had extensive amount of low-grade superficial transitional cell carcinoma involving the posterior wall and left lateral wall predominantly as well as the anterior wall/dome.  EBL: 50cc  Specimens: Bladder tumor  Indication: Joan Ramirez is a 58 y.o. patient with history of transitional cell carcinoma that initially presented in the left upper tract and is status post left nephro ureterectomy.  Several years later she presented with gross hematuria was noted to have extensive amount of transitional cell carcinoma within her bladder.  He underwent 2 resections and BCG.  Her follow-up cystoscopy demonstrated recurrence of her cancer..  After reviewing the management options for treatment, he elected to proceed with the above surgical procedure(s). We have discussed the potential benefits and risks of the procedure, side effects of the proposed treatment, the likelihood of the patient achieving the goals of the procedure, and any potential problems that might occur during the procedure or recuperation. Informed consent has been obtained.  Description of procedure:  The patient was taken to the operating room and general anesthesia was induced.  The patient was placed in the dorsal lithotomy position, prepped and draped in the usual sterile fashion, and  preoperative antibiotics were administered. A preoperative time-out was performed.   A 21 French 30 degrees cystoscope was gently passed through the patient's urethra and into the bladder under visual guidance.  Cystoscopy then commenced with the above findings.  I then used a 5 [place] open-ended ureteral cath and performed retrograde pyelogram with the above findings.  I then remove the 21 French sheath and exchanged for a 26 French sheath using the visual obturator.  I then exchanged the obturator for the loop element and proceeded to perform a TURBT in the routine fashion.  There were more than 20 small satellite lesions that were all superficial.  There was a lesion in the trigonal region that was approximately 5 cm long.  There was several lesions along the left lateral wall as well as the dome.  Once the tumors had all been completely resected and fulgurated reinspection of these areas demonstrated no residual tumor or ongoing bleeding.  The tumors were irrigated out and then the bladder was flushed with 2 L of sterile water.  I placed a B&O suppository in the patient's rectum and she was subsequently extubated and returned to the PACU in good condition.  There is no Foley catheter left at this time.  Ardis Hughs, M.D.

## 2019-02-06 NOTE — H&P (Signed)
f/u for transitional cell carcinoma  HPI: Joan Ramirez is a 58 year-old female established patient who is here for surveillance of bladder cancer.  She underwent a TURBT. Her last bladder tumor was resected approximately 07/12/2018. She has had the a total of 2 bladder resections. The patient had a left nephrouretectomy. Surgery date:. The patient had their first TURBT in approximately 06/28/2018.   Tumor Pathology: T1aN0 in left kidney 10/10/15; [place] 1/20.   She had treatment with the following intravesical agents: bcg. The patient underwent induction therapy.   The patient's last treatment was approximately 09/26/2018.   Her last radiologic test to evaluate the kidneys was approximately 07/12/2018. Imaging results: Ureteroscopy - clear (small stone). The patient did not have labs prior to her office visit today.   She is not having pain in new locations. She has not had blood in her urine recently. She has not recently had unwanted weight loss.   Developed hematuria intermittently for the past several months. A CT scan was performed by her primary care provider on April 30, 2018. This demonstrated numerous peripherally oriented irregular rounded nodular soft tissue density filling defects within the bladder lumen. The largest 1 of these measures up to 3.2 cm in diameter. In addition, the patient had a irregularity of the upper pole of her left kidney. s/p TURBT x 2, right diagnostic ureterscopy.   Intv: Patient is here for her first cystoscopy after completing induction BCG.     ALLERGIES: No Allergies    MEDICATIONS: Metformin Hcl 500 mg tablet  Bupropion Hcl  Chantix  Glimepiride  Losartan Potassium 25 mg tablet     GU PSH: Bladder Instill AntiCA Agent - 10/19/2018, 10/12/2018, 10/04/2018, 09/28/2018, 09/21/2018, 09/14/2018 Cysto Uretero Biopsy Fulgura - 10-10-15 Cystoscopy Insert Stent, Right - 07/12/2018, October 10, 2015 Cystoscopy TURBT >5 cm - 06/28/2018 Cystoscopy TURBT 2-5 cm - 07/12/2018 Inject For  cystogram - 10-10-15 Lap Nephro Ureterectomy, Left - 10-Oct-2015 Ureteroscopic stone removal, Right - 07/12/2018       PSH Notes: Cystoscopy With Ureteroscopy For Biopsy Left, Cystoscopy With Insertion Of Ureteral Stent Left, No Surgical Problems   NON-GU PSH: None   GU PMH: Bladder Cancer overlapping sites - 07/06/2018 Gross hematuria - 07/06/2018 Renal pelvis cancer, 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, Unspec, 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 Neoplasm of unspecified behavior of unspecified kidney, Neoplasm of renal pelvis - 2015-10-10 Benign Neo Lft adrenal gland, Adrenal adenoma, left - 2015/10/10 Benign Neo Rt adrenal gland, Adrenal adenoma, right - 10/10/15 Hydronephrosis Unspec, Hydronephrosis, left - 2015-10-10 Renal calculus, Renal calculus, bilateral - 10/10/2015    NON-GU PMH: Encounter for antineoplastic chemotherapy - 10/04/2018 Encounter for general adult medical examination without abnormal findings, Encounter for preventive health examination - 10-Oct-2015 Personal history of other diseases of the respiratory system, History of asthma - 10/10/15 Personal history of other endocrine, nutritional and metabolic disease, History of diabetes mellitus - October 10, 2015    FAMILY HISTORY: Death of family member - Runs In Family Diabetes - Runs In Family Lung Cancer - Runs In Family lymphoma - Runs In Family skin cancer - Runs In Family   SOCIAL HISTORY: Marital Status: Single Preferred Language: English; Ethnicity: Not Hispanic Or Latino; Race: White Current Smoking Status: Patient does not smoke anymore. Has not smoked since 06/27/2018.   Tobacco Use Assessment Completed: Used Tobacco in last 30 days?     Notes: Occupation, Alcohol use, Single, Caffeine use, Current every day smoker   REVIEW OF SYSTEMS:    GU Review  Female:   Patient denies frequent urination, hard to postpone urination, burning /pain with urination, get up at night to urinate, leakage of urine, stream starts and stops, trouble starting your stream, have to  strain to urinate, and being pregnant.  Gastrointestinal (Upper):   Patient denies nausea, vomiting, and indigestion/ heartburn.  Gastrointestinal (Lower):   Patient denies diarrhea and constipation.  Constitutional:   Patient denies fever, night sweats, weight loss, and fatigue.  Skin:   Patient denies skin rash/ lesion and itching.  Eyes:   Patient denies blurred vision and double vision.  Ears/ Nose/ Throat:   Patient denies sore throat and sinus problems.  Hematologic/Lymphatic:   Patient denies swollen glands and easy bruising.  Cardiovascular:   Patient denies leg swelling and chest pains.  Respiratory:   Patient denies cough and shortness of breath.  Endocrine:   Patient denies excessive thirst.  Musculoskeletal:   Patient denies back pain and joint pain.  Neurological:   Patient denies headaches and dizziness.  Psychologic:   Patient denies depression and anxiety.   VITAL SIGNS:      01/21/2019 08:52 AM  Temperature 97.1 F / 36.1 C   MULTI-SYSTEM PHYSICAL EXAMINATION:    Constitutional: Well-nourished. No physical deformities. Normally developed. Good grooming.  Respiratory: Normal breath sounds. No labored breathing, no use of accessory muscles.   Cardiovascular: Regular rate and rhythm. No murmur, no gallop. Normal temperature, normal extremity pulses, no swelling, no varicosities.      PAST DATA REVIEWED:  Source Of History:  Patient  Records Review:   Pathology Reports, Previous Doctor Records, Previous Patient Records, POC Tool   PROCEDURES:         Flexible Cystoscopy - 52000  Risks, benefits, and some of the potential complications of the procedure were discussed at length with the patient including infection, bleeding, voiding discomfort, urinary retention, fever, chills, sepsis, and others. All questions were answered. Informed consent was obtained. Antibiotic prophylaxis was given. Sterile technique and intraurethral analgesia were used.  Meatus:  Normal size. Normal  location. Normal condition.  Urethra:  No hypermobility. No leakage.  Ureteral Orifices:  Normal location. Normal size. Normal shape. Effluxed clear urine.  Bladder:  There is a large tumor in the posterior wall with several satellite lesions at the dome as well as in the posterior lateral walls.      The lower urinary tract was carefully examined. The procedure was well-tolerated and without complications. Antibiotic instructions were given. Instructions were given to call the office immediately for bloody urine, difficulty urinating, urinary retention, painful or frequent urination, fever, chills, nausea, vomiting or other illness. The patient stated that she understood these instructions and would comply with them.         Urinalysis w/Scope Dipstick Dipstick Cont'd Micro  Color: Yellow Bilirubin: Neg mg/dL WBC/hpf: 6 - 10/hpf  Appearance: Clear Ketones: Neg mg/dL RBC/hpf: 0 - 2/hpf  Specific Gravity: 1.010 Blood: Neg ery/uL Bacteria: Rare (0-9/hpf)  pH: <=5.0 Protein: Neg mg/dL Cystals: NS (Not Seen)  Glucose: Neg mg/dL Urobilinogen: 0.2 mg/dL Casts: NS (Not Seen)    Nitrites: Neg Trichomonas: Not Present    Leukocyte Esterase: 1+ leu/uL Mucous: Not Present      Epithelial Cells: 0 - 5/hpf      Yeast: NS (Not Seen)      Sperm: Not Present    ASSESSMENT:      ICD-10 Details  1 GU:   Bladder Cancer overlapping sites - C67.8    PLAN:  Document Letter(s):  Created for Patient: Clinical Summary         Notes:   The patient has recurrence of her bladder cancer. Our plan is to take the patient to the operating room for re-transurethral resection of the bladder tumor as well as a right retrograde pyelogram. I went through the procedure with her again, and answered all her questions. Unfortunately, she has failed BCG, but may be a good candidate for the check point he better clinical trial. We will look into this for her.

## 2019-02-07 ENCOUNTER — Encounter (HOSPITAL_BASED_OUTPATIENT_CLINIC_OR_DEPARTMENT_OTHER): Payer: Self-pay | Admitting: Urology

## 2019-03-14 HISTORY — PX: FRACTURE SURGERY: SHX138

## 2019-12-10 ENCOUNTER — Other Ambulatory Visit: Payer: Self-pay | Admitting: Urology

## 2019-12-11 ENCOUNTER — Other Ambulatory Visit: Payer: Self-pay | Admitting: Urology

## 2019-12-16 ENCOUNTER — Other Ambulatory Visit: Payer: Self-pay | Admitting: Urology

## 2019-12-20 NOTE — Patient Instructions (Addendum)
DUE TO COVID-19 ONLY ONE VISITOR IS ALLOWED TO COME WITH YOU AND STAY IN THE WAITING ROOM ONLY DURING PRE OP AND PROCEDURE DAY OF SURGERY. THE 1 VISITOR MAY VISIT WITH YOU AFTER SURGERY IN YOUR PRIVATE ROOM DURING VISITING HOURS ONLY!  YOU NEED TO HAVE A COVID 19 TEST ON__7/6_____ @_8 :40______, THIS TEST MUST BE DONE BEFORE SURGERY, COME  [address], [place]  , [address].  ([place]) ONCE YOUR COVID TEST IS COMPLETED, PLEASE BEGIN THE QUARANTINE INSTRUCTIONS AS OUTLINED IN YOUR HANDOUT.                Joan Ramirez    Your procedure is scheduled on: 01/02/20   Report to [facility] Main  Entrance   Report to Short Stay at 5:30 AM     Call this number if you have problems the morning of surgery [phone]    Remember: Do not eat food or drink liquids :After Midnight.   BRUSH YOUR TEETH MORNING OF SURGERY AND RINSE YOUR MOUTH OUT, NO CHEWING GUM CANDY OR MINTS.     Take these medicines the morning of surgery with A SIP OF WATER: none  DO NOT TAKE ANY DIABETIC MEDICATIONS DAY OF YOUR SURGERY   How to Manage Your Diabetes Before and After Surgery  Why is it important to control my blood sugar before and after surgery? . Improving blood sugar levels before and after surgery helps healing and can limit problems. . A way of improving blood sugar control is eating a healthy diet by: o  Eating less sugar and carbohydrates o  Increasing activity/exercise o  Talking with your doctor about reaching your blood sugar goals . High blood sugars (greater than 180 mg/dL) can raise your risk of infections and slow your recovery, so you will need to focus on controlling your diabetes during the weeks before surgery. . Make sure that the doctor who takes care of your diabetes knows about your planned surgery including the date and location.  How do I manage my blood sugar before surgery? . Check your blood sugar at least 4 times a day, starting 2 days before  surgery, to make sure that the level is not too high or low. o Check your blood sugar the morning of your surgery when you wake up and every 2 hours until you get to the Short Stay unit. . If your blood sugar is less than 70 mg/dL, you will need to treat for low blood sugar: o Do not take insulin. o Treat a low blood sugar (less than 70 mg/dL) with  cup of clear juice (cranberry or apple), 4 glucose tablets, OR glucose gel. o Recheck blood sugar in 15 minutes after treatment (to make sure it is greater than 70 mg/dL). If your blood sugar is not greater than 70 mg/dL on recheck, call [phone] for further instructions. . Report your blood sugar to the short stay nurse when you get to Short Stay.  . If you are admitted to the hospital after surgery: o Your blood sugar will be checked by the staff and you will probably be given insulin after surgery (instead of oral diabetes medicines) to make sure you have good blood sugar levels. o The goal for blood sugar control after surgery is 80-180 mg/dL.   WHAT DO I DO ABOUT MY DIABETES MEDICATION?  Marland Kitchen Do not take oral diabetes medicines (pills) the morning of surgery. Marland Kitchen  You may not have any metal on your body including hair pins and              piercings  Do not wear jewelry, make-up, lotions, powders or perfumes, deodorant             Do not wear nail polish on your fingernails.  Do not shave  48 hours prior to surgery.                Do not bring valuables to the hospital. [place].  Contacts, dentures or bridgework may not be worn into surgery.       Patients discharged the day of surgery will not be allowed to drive home.   IF YOU ARE HAVING SURGERY AND GOING HOME THE SAME DAY, YOU MUST HAVE AN ADULT TO DRIVE YOU HOME AND BE WITH YOU FOR 24 HOURS.   YOU MAY GO HOME BY TAXI OR UBER OR ORTHERWISE, BUT AN ADULT MUST ACCOMPANY YOU HOME AND STAY WITH YOU FOR 24  HOURS.  Name and phone number of your driver:  Special Instructions: N/A              Please read over the following fact sheets you were given: _____________________________________________________________________             [facility] - Preparing for Surgery Before surgery, you can play an important role .  Because skin is not sterile, your skin needs to be as free of germs as possible.   You can reduce the number of germs on your skin by washing with CHG (chlorahexidine gluconate) soap before surgery.   CHG is an antiseptic cleaner which kills germs and bonds with the skin to continue killing germs even after washing. Please DO NOT use if you have an allergy to CHG or antibacterial soaps .  If your skin becomes reddened/irritated stop using the CHG and inform your nurse when you arrive at Short Stay. Do not shave (including legs and underarms) for at least 48 hours prior to the first CHG shower.    Please follow these instructions carefully:  1.  Shower with CHG Soap the night before surgery and the  morning of Surgery.  2.  If you choose to wash your hair, wash your hair first as usual with your  normal  shampoo.  3.  After you shampoo, rinse your hair and body thoroughly to remove the  shampoo.                                        4.  Use CHG as you would any other liquid soap.  You can apply chg directly  to the skin and wash                       Gently with a scrungie or clean washcloth.  5.  Apply the CHG Soap to your body ONLY FROM THE NECK DOWN.   Do not use on face/ open                           Wound or open sores. Avoid contact with eyes, ears mouth and genitals (private parts).  Wash face,  Genitals (private parts) with your normal soap.             6.  Wash thoroughly, paying special attention to the area where your surgery  will be performed.  7.  Thoroughly rinse your body with warm water from the neck down.  8.  DO NOT shower/wash with your  normal soap after using and rinsing off  the CHG Soap.             9.  Pat yourself dry with a clean towel.            10.  Wear clean pajamas.            11.  Place clean sheets on your bed the night of your first shower and do not  sleep with pets. Day of Surgery : Do not apply any lotions/deodorants the morning of surgery.  Please wear clean clothes to the hospital/surgery center.  FAILURE TO FOLLOW THESE INSTRUCTIONS MAY RESULT IN THE CANCELLATION OF YOUR SURGERY PATIENT SIGNATURE_________________________________  NURSE SIGNATURE__________________________________  ________________________________________________________________________

## 2019-12-23 ENCOUNTER — Other Ambulatory Visit: Payer: Self-pay

## 2019-12-23 ENCOUNTER — Encounter (HOSPITAL_COMMUNITY)
Admission: RE | Admit: 2019-12-23 | Discharge: 2019-12-23 | Disposition: A | Discharge status: Home / Self Care | Payer: Managed Care, Other (non HMO) | Source: Ambulatory Visit | Attending: Urology | Admitting: Urology

## 2019-12-23 ENCOUNTER — Encounter (HOSPITAL_COMMUNITY): Payer: Self-pay

## 2019-12-23 DIAGNOSIS — Z01818 Encounter for other preprocedural examination: Secondary | ICD-10-CM | POA: Diagnosis not present

## 2019-12-23 DIAGNOSIS — E118 Type 2 diabetes mellitus with unspecified complications: Secondary | ICD-10-CM | POA: Diagnosis not present

## 2019-12-23 HISTORY — DX: Personal history of urinary calculi: Z87.442

## 2019-12-23 HISTORY — DX: Chronic kidney disease, unspecified: N18.9

## 2019-12-23 LAB — CBC
HCT: 39.9 % (ref 36.0–46.0)
Hemoglobin: 12.6 g/dL (ref 12.0–15.0)
MCH: 30.5 pg (ref 26.0–34.0)
MCHC: 31.6 g/dL (ref 30.0–36.0)
MCV: 96.6 fL (ref 80.0–100.0)
Platelets: 406 10*3/uL — ABNORMAL HIGH (ref 150–400)
RBC: 4.13 MIL/uL (ref 3.87–5.11)
RDW: 12.8 % (ref 11.5–15.5)
WBC: 12 10*3/uL — ABNORMAL HIGH (ref 4.0–10.5)
nRBC: 0 % (ref 0.0–0.2)

## 2019-12-23 LAB — BASIC METABOLIC PANEL
Anion gap: 8 (ref 5–15)
BUN: 29 mg/dL — ABNORMAL HIGH (ref 6–20)
CO2: 27 mmol/L (ref 22–32)
Calcium: 9.1 mg/dL (ref 8.9–10.3)
Chloride: 103 mmol/L (ref 98–111)
Creatinine, Ser: 1.04 mg/dL — ABNORMAL HIGH (ref 0.44–1.00)
GFR calc Af Amer: >60 mL/min (ref >60)
GFR calc non Af Amer: 59 mL/min — ABNORMAL LOW (ref >60)
Glucose, Bld: 141 mg/dL — ABNORMAL HIGH (ref 70–99)
Potassium: 4.4 mmol/L (ref 3.5–5.1)
Sodium: 138 mmol/L (ref 135–145)

## 2019-12-23 LAB — HEMOGLOBIN A1C
Hgb A1c MFr Bld: 6.2 % — ABNORMAL HIGH (ref 4.8–5.6)
Mean Plasma Glucose: 131.24 mg/dL

## 2019-12-23 LAB — GLUCOSE, CAPILLARY: Glucose-Capillary: 170 mg/dL — ABNORMAL HIGH (ref 70–99)

## 2019-12-23 NOTE — Progress Notes (Addendum)
COVID Vaccine Completed:yes Date COVID Vaccine completed:10/25/29 COVID vaccine manufacturer: Pfizer      PCP - Dr. Loa Socks Cardiologist - no  Chest x-ray - no EKG - 12/23/19 Stress Test -no  ECHO - no Cardiac Cath - no  Sleep Study - no CPAP -   Fasting Blood Sugar - 80-130 Checks Blood Sugar _____ times a day"hardly ever"  Blood Thinner Instructions:NA Aspirin Instructions: Last Dose:  Anesthesia review:   Patient denies shortness of breath, fever, cough and chest pain at PAT appointment Yes   Patient verbalized understanding of instructions that were given to them at the PAT appointment. Patient was also instructed that they will need to review over the PAT instructions again at home before surgery. Yes Pt's Lt Knee is in poor alignment from a previous surgery. sha has no SOB but is sedentary, uses a wheelchair, cane and walker at  Home. She lives alone.

## 2019-12-31 ENCOUNTER — Other Ambulatory Visit (HOSPITAL_COMMUNITY)
Admission: RE | Admit: 2019-12-31 | Discharge: 2019-12-31 | Disposition: A | Discharge status: Home / Self Care | Payer: Managed Care, Other (non HMO) | Source: Ambulatory Visit | Attending: Urology | Admitting: Urology

## 2019-12-31 DIAGNOSIS — Z20822 Contact with and (suspected) exposure to covid-19: Secondary | ICD-10-CM | POA: Diagnosis not present

## 2019-12-31 DIAGNOSIS — Z01812 Encounter for preprocedural laboratory examination: Secondary | ICD-10-CM | POA: Insufficient documentation

## 2019-12-31 LAB — SARS CORONAVIRUS 2 (TAT 6-24 HRS): SARS Coronavirus 2: NEGATIVE

## 2020-01-02 ENCOUNTER — Encounter (HOSPITAL_COMMUNITY): Payer: Self-pay | Admitting: Urology

## 2020-01-02 ENCOUNTER — Ambulatory Visit (HOSPITAL_COMMUNITY): Payer: Managed Care, Other (non HMO) | Admitting: Certified Registered Nurse Anesthetist

## 2020-01-02 ENCOUNTER — Encounter (HOSPITAL_COMMUNITY)
Admission: RE | Disposition: A | Discharge status: Home / Self Care | Payer: Self-pay | Source: Home / Self Care | Attending: Urology

## 2020-01-02 ENCOUNTER — Ambulatory Visit (HOSPITAL_COMMUNITY): Payer: Managed Care, Other (non HMO)

## 2020-01-02 ENCOUNTER — Ambulatory Visit (HOSPITAL_COMMUNITY)
Admission: RE | Admit: 2020-01-02 | Discharge: 2020-01-02 | Disposition: A | Discharge status: Home / Self Care | Payer: Managed Care, Other (non HMO) | Attending: Urology | Admitting: Urology

## 2020-01-02 DIAGNOSIS — E669 Obesity, unspecified: Secondary | ICD-10-CM | POA: Diagnosis not present

## 2020-01-02 DIAGNOSIS — Z7984 Long term (current) use of oral hypoglycemic drugs: Secondary | ICD-10-CM | POA: Insufficient documentation

## 2020-01-02 DIAGNOSIS — Z833 Family history of diabetes mellitus: Secondary | ICD-10-CM | POA: Diagnosis not present

## 2020-01-02 DIAGNOSIS — Z6836 Body mass index (BMI) 36.0-36.9, adult: Secondary | ICD-10-CM | POA: Diagnosis not present

## 2020-01-02 DIAGNOSIS — Z905 Acquired absence of kidney: Secondary | ICD-10-CM | POA: Diagnosis not present

## 2020-01-02 DIAGNOSIS — Z8554 Personal history of malignant neoplasm of ureter: Secondary | ICD-10-CM | POA: Diagnosis not present

## 2020-01-02 DIAGNOSIS — Z8553 Personal history of malignant neoplasm of renal pelvis: Secondary | ICD-10-CM | POA: Insufficient documentation

## 2020-01-02 DIAGNOSIS — Z906 Acquired absence of other parts of urinary tract: Secondary | ICD-10-CM | POA: Diagnosis not present

## 2020-01-02 DIAGNOSIS — Z801 Family history of malignant neoplasm of trachea, bronchus and lung: Secondary | ICD-10-CM | POA: Insufficient documentation

## 2020-01-02 DIAGNOSIS — C679 Malignant neoplasm of bladder, unspecified: Secondary | ICD-10-CM | POA: Diagnosis present

## 2020-01-02 DIAGNOSIS — K219 Gastro-esophageal reflux disease without esophagitis: Secondary | ICD-10-CM | POA: Insufficient documentation

## 2020-01-02 DIAGNOSIS — Z87891 Personal history of nicotine dependence: Secondary | ICD-10-CM | POA: Insufficient documentation

## 2020-01-02 DIAGNOSIS — E119 Type 2 diabetes mellitus without complications: Secondary | ICD-10-CM | POA: Diagnosis not present

## 2020-01-02 DIAGNOSIS — Z807 Family history of other malignant neoplasms of lymphoid, hematopoietic and related tissues: Secondary | ICD-10-CM | POA: Diagnosis not present

## 2020-01-02 DIAGNOSIS — C678 Malignant neoplasm of overlapping sites of bladder: Secondary | ICD-10-CM | POA: Diagnosis not present

## 2020-01-02 DIAGNOSIS — Z79899 Other long term (current) drug therapy: Secondary | ICD-10-CM | POA: Diagnosis not present

## 2020-01-02 DIAGNOSIS — I10 Essential (primary) hypertension: Secondary | ICD-10-CM | POA: Insufficient documentation

## 2020-01-02 DIAGNOSIS — Z808 Family history of malignant neoplasm of other organs or systems: Secondary | ICD-10-CM | POA: Diagnosis not present

## 2020-01-02 HISTORY — PX: CYSTOSCOPY W/ RETROGRADES: SHX1426

## 2020-01-02 HISTORY — PX: TRANSURETHRAL RESECTION OF BLADDER TUMOR: SHX2575

## 2020-01-02 LAB — GLUCOSE, CAPILLARY
Glucose-Capillary: 169 mg/dL — ABNORMAL HIGH (ref 70–99)
Glucose-Capillary: 149 mg/dL — ABNORMAL HIGH (ref 70–99)

## 2020-01-02 SURGERY — TURBT (TRANSURETHRAL RESECTION OF BLADDER TUMOR)
Anesthesia: General | Site: Ureter | Laterality: Right

## 2020-01-02 MED ORDER — PHENAZOPYRIDINE HCL 200 MG PO TABS: 200.0000 mg | ORAL_TABLET | Freq: Three times a day (TID) | ORAL | 0 refills | Status: AC | PRN

## 2020-01-02 MED ORDER — ONDANSETRON HCL 4 MG/2ML IJ SOLN
INTRAMUSCULAR | Status: DC | PRN
  Administered 2020-01-02: 4 mg via INTRAVENOUS

## 2020-01-02 MED ORDER — STERILE WATER FOR IRRIGATION IR SOLN
Status: DC | PRN
  Administered 2020-01-02: 1000 mL

## 2020-01-02 MED ORDER — LIDOCAINE 2% (20 MG/ML) 5 ML SYRINGE
INTRAMUSCULAR | Status: AC
  Filled 2020-01-02: qty 5

## 2020-01-02 MED ORDER — SUGAMMADEX SODIUM 200 MG/2ML IV SOLN
INTRAVENOUS | Status: DC | PRN
  Administered 2020-01-02: 200 mg via INTRAVENOUS

## 2020-01-02 MED ORDER — PROPOFOL 10 MG/ML IV BOLUS
INTRAVENOUS | Status: DC | PRN
  Administered 2020-01-02: 50 mg via INTRAVENOUS
  Administered 2020-01-02: 150 mg via INTRAVENOUS

## 2020-01-02 MED ORDER — ONDANSETRON HCL 4 MG/2ML IJ SOLN
INTRAMUSCULAR | Status: AC
  Filled 2020-01-02: qty 2

## 2020-01-02 MED ORDER — LIDOCAINE 2% (20 MG/ML) 5 ML SYRINGE
INTRAMUSCULAR | Status: DC | PRN
  Administered 2020-01-02: 60 mg via INTRAVENOUS

## 2020-01-02 MED ORDER — PROPOFOL 10 MG/ML IV BOLUS
INTRAVENOUS | Status: AC
  Filled 2020-01-02: qty 20

## 2020-01-02 MED ORDER — PROPOFOL 500 MG/50ML IV EMUL
INTRAVENOUS | Status: DC | PRN
  Administered 2020-01-02: 150 ug/kg/min via INTRAVENOUS

## 2020-01-02 MED ORDER — MIDAZOLAM HCL 5 MG/5ML IJ SOLN
INTRAMUSCULAR | Status: DC | PRN
  Administered 2020-01-02: 2 mg via INTRAVENOUS

## 2020-01-02 MED ORDER — ACETAMINOPHEN 10 MG/ML IV SOLN
INTRAVENOUS | Status: AC
  Filled 2020-01-02: qty 100

## 2020-01-02 MED ORDER — BELLADONNA ALKALOIDS-OPIUM 16.2-30 MG RE SUPP
RECTAL | Status: AC
  Filled 2020-01-02: qty 1

## 2020-01-02 MED ORDER — FENTANYL CITRATE (PF) 250 MCG/5ML IJ SOLN
INTRAMUSCULAR | Status: AC
  Filled 2020-01-02: qty 5

## 2020-01-02 MED ORDER — BELLADONNA ALKALOIDS-OPIUM 16.2-60 MG RE SUPP
RECTAL | Status: DC | PRN
  Administered 2020-01-02: 1 via RECTAL

## 2020-01-02 MED ORDER — SCOPOLAMINE 1 MG/3DAYS TD PT72
1.0000 | MEDICATED_PATCH | TRANSDERMAL | Status: DC
  Administered 2020-01-02: 1.5 mg via TRANSDERMAL

## 2020-01-02 MED ORDER — DEXAMETHASONE SODIUM PHOSPHATE 10 MG/ML IJ SOLN
INTRAMUSCULAR | Status: DC | PRN
  Administered 2020-01-02: 10 mg via INTRAVENOUS

## 2020-01-02 MED ORDER — LIDOCAINE HCL URETHRAL/MUCOSAL 2 % EX GEL
CUTANEOUS | Status: AC
  Filled 2020-01-02: qty 30

## 2020-01-02 MED ORDER — TRAMADOL HCL 50 MG PO TABS: 50.0000 mg | ORAL_TABLET | Freq: Four times a day (QID) | ORAL | 0 refills | Status: AC | PRN

## 2020-01-02 MED ORDER — ACETAMINOPHEN 10 MG/ML IV SOLN: 1000.0000 mg | Freq: Once | INTRAVENOUS | Status: DC

## 2020-01-02 MED ORDER — SODIUM CHLORIDE 0.9 % IR SOLN
Status: DC | PRN
  Administered 2020-01-02: 29000 mL

## 2020-01-02 MED ORDER — LACTATED RINGERS IV SOLN: INTRAVENOUS | Status: DC

## 2020-01-02 MED ORDER — ROCURONIUM BROMIDE 10 MG/ML (PF) SYRINGE
PREFILLED_SYRINGE | INTRAVENOUS | Status: AC
  Filled 2020-01-02: qty 10

## 2020-01-02 MED ORDER — ORAL CARE MOUTH RINSE: 15.0000 mL | Freq: Once | OROMUCOSAL | Status: AC

## 2020-01-02 MED ORDER — CEFAZOLIN SODIUM-DEXTROSE 2-4 GM/100ML-% IV SOLN
2.0000 g | INTRAVENOUS | Status: AC
  Administered 2020-01-02: 2 g via INTRAVENOUS
  Filled 2020-01-02: qty 100

## 2020-01-02 MED ORDER — DEXAMETHASONE SODIUM PHOSPHATE 10 MG/ML IJ SOLN
INTRAMUSCULAR | Status: AC
  Filled 2020-01-02: qty 1

## 2020-01-02 MED ORDER — MIDAZOLAM HCL 2 MG/2ML IJ SOLN
INTRAMUSCULAR | Status: AC
  Filled 2020-01-02: qty 2

## 2020-01-02 MED ORDER — ROCURONIUM BROMIDE 10 MG/ML (PF) SYRINGE
PREFILLED_SYRINGE | INTRAVENOUS | Status: DC | PRN
  Administered 2020-01-02: 100 mg via INTRAVENOUS

## 2020-01-02 MED ORDER — CHLORHEXIDINE GLUCONATE 0.12 % MT SOLN
15.0000 mL | Freq: Once | OROMUCOSAL | Status: AC
  Administered 2020-01-02: 15 mL via OROMUCOSAL

## 2020-01-02 MED ORDER — SCOPOLAMINE 1 MG/3DAYS TD PT72
MEDICATED_PATCH | TRANSDERMAL | Status: AC
  Filled 2020-01-02: qty 1

## 2020-01-02 MED ORDER — FENTANYL CITRATE (PF) 100 MCG/2ML IJ SOLN
INTRAMUSCULAR | Status: DC | PRN
  Administered 2020-01-02: 50 ug via INTRAVENOUS
  Administered 2020-01-02: 100 ug via INTRAVENOUS

## 2020-01-02 MED ORDER — IOHEXOL 300 MG/ML  SOLN
INTRAMUSCULAR | Status: DC | PRN
  Administered 2020-01-02: 26 mL

## 2020-01-02 MED ORDER — ACETAMINOPHEN 500 MG PO TABS
1000.0000 mg | ORAL_TABLET | Freq: Once | ORAL | Status: AC
  Administered 2020-01-02: 1000 mg via ORAL

## 2020-01-02 MED ORDER — LIDOCAINE HCL URETHRAL/MUCOSAL 2 % EX GEL
CUTANEOUS | Status: DC | PRN
  Administered 2020-01-02: 1 via URETHRAL

## 2020-01-02 SURGICAL SUPPLY — 26 items
BAG URO CATCHER STRL LF (MISCELLANEOUS) ×3 IMPLANT
CATH URET 5FR 28IN OPEN ENDED (CATHETERS) ×3 IMPLANT
CLOTH BEACON ORANGE TIMEOUT ST (SAFETY) ×3 IMPLANT
COVER WAND RF STERILE (DRAPES) IMPLANT
ELECT REM PT RETURN 15FT ADLT (MISCELLANEOUS) ×3 IMPLANT
FIBER LASER FLEXIVA 1000 (UROLOGICAL SUPPLIES) IMPLANT
FIBER LASER FLEXIVA 365 (UROLOGICAL SUPPLIES) IMPLANT
FIBER LASER FLEXIVA 550 (UROLOGICAL SUPPLIES) IMPLANT
FIBER LASER TRAC TIP (UROLOGICAL SUPPLIES) IMPLANT
GLOVE BIO SURGEON STRL SZ7.5 (GLOVE) ×3 IMPLANT
GLOVE BIOGEL M STRL SZ7.5 (GLOVE) ×3 IMPLANT
GOWN STRL REUS W/TWL LRG LVL3 (GOWN DISPOSABLE) ×3 IMPLANT
GOWN STRL REUS W/TWL XL LVL3 (GOWN DISPOSABLE) ×3 IMPLANT
GUIDEWIRE STR DUAL SENSOR (WIRE) ×3 IMPLANT
KIT TURNOVER KIT A (KITS) IMPLANT
LOOP CUT BIPOLAR 24F LRG (ELECTROSURGICAL) ×3 IMPLANT
MANIFOLD NEPTUNE II (INSTRUMENTS) ×3 IMPLANT
NDL SAFETY ECLIPSE 18X1.5 (NEEDLE) ×2 IMPLANT
NEEDLE HYPO 18GX1.5 SHARP (NEEDLE) ×3
NS IRRIG 1000ML POUR BTL (IV SOLUTION) IMPLANT
PACK CYSTO (CUSTOM PROCEDURE TRAY) ×3 IMPLANT
PENCIL SMOKE EVACUATOR (MISCELLANEOUS) IMPLANT
SYR TOOMEY IRRIG 70ML (MISCELLANEOUS)
SYRINGE TOOMEY IRRIG 70ML (MISCELLANEOUS) IMPLANT
TUBING CONNECTING 10 (TUBING) ×3 IMPLANT
TUBING UROLOGY SET (TUBING) ×3 IMPLANT

## 2020-01-02 NOTE — H&P (Signed)
f/u for transitional cell carcinoma  HPI: Joan Ramirez is a 59 year-old female established patient who is here for surveillance of bladder cancer.  She underwent a TURBT. Her last bladder tumor was resected approximately 07/12/2018. She has had the a total of 2 bladder resections. The patient had a left nephrouretectomy. Surgery date:. The patient had their first TURBT in approximately 06/28/2018.   Tumor Pathology: T1aN0 in left kidney Sep 26, 2015; [place] 1/20.   She had treatment with the following intravesical agents: bcg. The patient underwent induction therapy.   The patient's last treatment was approximately 09/26/2018.   Her last radiologic test to evaluate the kidneys was approximately 07/12/2018. Imaging results: Ureteroscopy - clear (small stone). The patient did not have labs prior to her office visit today.   She is not having pain in new locations. She has not had blood in her urine recently. She has not recently had unwanted weight loss.   Developed hematuria intermittently for the past several months. A CT scan was performed by her primary care provider on April 30, 2018. This demonstrated numerous peripherally oriented irregular rounded nodular soft tissue density filling defects within the bladder lumen. The largest 1 of these measures up to 3.2 cm in diameter. In addition, the patient had a irregularity of the upper pole of her left kidney. s/p TURBT x 2, right diagnostic ureterscopy.   Intv: Patient is here for her first cystoscopy. She was unable to complete BCG because she fractured her femur. She has now completed the treatment for that had surgery. She recovered reasonably well. She was last seen in September 2020. The patient has been doing reasonably well. She has had blood in her urine a few times. She denies any dysuria. She denies any incontinence.     ALLERGIES: No Allergies    MEDICATIONS: Metformin Hcl 500 mg tablet  Balance Of Nature  Bupropion Hcl  Glimepiride   Losartan Potassium 25 mg tablet     GU PSH: Bladder Instill AntiCA Agent - 03/08/2019, 10/19/2018, 10/12/2018, 10/04/2018, 09/28/2018, 09/21/2018, 09/14/2018 Cysto Uretero Biopsy Fulgura - 2015-09-26 Cystoscopy - 01/21/2019 Cystoscopy Insert Stent, Right - 07/12/2018, 09/26/15 Cystoscopy TURBT >5 cm - 02/06/2019, 06/28/2018 Cystoscopy TURBT 2-5 cm - 07/12/2018 Inject For cystogram - September 26, 2015 Lap Nephro Ureterectomy, Left - 09/26/15 Ureteroscopic stone removal, Right - 07/12/2018       PSH Notes: Cystoscopy With Ureteroscopy For Biopsy Left, Cystoscopy With Insertion Of Ureteral Stent Left, Broken right femur-repaired with screws and rods   NON-GU PSH: None   GU PMH: Bladder Cancer overlapping sites - 07/06/2018 Gross hematuria - 07/06/2018 Renal pelvis cancer, 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, Unspec, 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 Neoplasm of unspecified behavior of unspecified kidney, Neoplasm of renal pelvis - Sep 26, 2015 Benign Neo Lft adrenal gland, Adrenal adenoma, left - 2015/09/26 Benign Neo Rt adrenal gland, Adrenal adenoma, right - 2015/09/26 Hydronephrosis Unspec, Hydronephrosis, left - 09-26-2015 Renal calculus, Renal calculus, bilateral - 09-26-2015    NON-GU PMH: Encounter for antineoplastic chemotherapy - 10/04/2018 Encounter for general adult medical examination without abnormal findings, Encounter for preventive health examination - Sep 26, 2015 Personal history of other diseases of the respiratory system, History of asthma - 09/26/15 Personal history of other endocrine, nutritional and metabolic disease, History of diabetes mellitus - 2015-09-26    FAMILY HISTORY: Death of family member - Runs In Family Diabetes - Runs In Family Lung Cancer - Runs In Family lymphoma - Runs In Family skin cancer - Runs In Family   SOCIAL  HISTORY: Marital Status: Single Preferred Language: English; Ethnicity: Not Hispanic Or Latino; Race: White Current Smoking Status: Patient does not smoke anymore. Has not smoked since 06/27/2018.   Tobacco Use  Assessment Completed: Used Tobacco in last 30 days?     Notes: Occupation, Alcohol use, Single, Caffeine use, Current every day smoker   REVIEW OF SYSTEMS:    GU Review Female:   Patient reports burning /pain with urination. Patient denies frequent urination, hard to postpone urination, get up at night to urinate, leakage of urine, stream starts and stops, trouble starting your stream, have to strain to urinate, and being pregnant.  Gastrointestinal (Upper):   Patient denies nausea, vomiting, and indigestion/ heartburn.  Gastrointestinal (Lower):   Patient reports constipation and diarrhea.   Constitutional:   Patient denies fever, night sweats, weight loss, and fatigue.  Skin:   Patient denies skin rash/ lesion and itching.  Eyes:   Patient denies blurred vision and double vision.  Ears/ Nose/ Throat:   Patient denies sore throat and sinus problems.  Hematologic/Lymphatic:   Patient denies swollen glands and easy bruising.  Cardiovascular:   Patient denies leg swelling and chest pains.  Respiratory:   Patient denies cough and shortness of breath.  Endocrine:   Patient denies excessive thirst.  Musculoskeletal:   Patient reports joint pain. Patient denies back pain.  Neurological:   Patient denies headaches and dizziness.  Psychologic:   Patient denies depression and anxiety.   VITAL SIGNS:      12/04/2019 03:22 PM  Weight 230 lb / 104.33 kg  BP 106/70 mmHg  Pulse 91 /min  Temperature 97.3 F / 36.2 C   Complexity of Data:  Records Review:   Pathology Reports, Previous Doctor Records, Previous Patient Records, POC Tool  Urine Test Review:   Urinalysis   PROCEDURES:         Flexible Cystoscopy - 52000  Risks, benefits, and some of the potential complications of the procedure were discussed at length with the patient including infection, bleeding, voiding discomfort, urinary retention, fever, chills, sepsis, and others. All questions were answered. Informed consent was obtained.  Antibiotic prophylaxis was given. Sterile technique and intraurethral analgesia were used.  Meatus:  Normal size. Normal location. Normal condition.  Urethra:  No hypermobility. No leakage.  Ureteral Orifices:  Normal location. Normal size. Normal shape. Effluxed clear urine.  Bladder:  The patient has diffuse papillary tumors throughout the bladder on the anterior and posterior walls.      The lower urinary tract was carefully examined. The procedure was well-tolerated and without complications. Antibiotic instructions were given. Instructions were given to call the office immediately for bloody urine, difficulty urinating, urinary retention, painful or frequent urination, fever, chills, nausea, vomiting or other illness. The patient stated that she understood these instructions and would comply with them.         Urinalysis Dipstick Dipstick Cont'd  Color: Yellow Bilirubin: Neg mg/dL  Appearance: Clear Ketones: Neg mg/dL  Specific Gravity: 1.025 Blood: Neg ery/uL  pH: <=5.0 Protein: Neg mg/dL  Glucose: Neg mg/dL Urobilinogen: 0.2 mg/dL    Nitrites: Neg    Leukocyte Esterase: Neg leu/uL    ASSESSMENT:      ICD-10 Details  1 GU:   Bladder Cancer overlapping sites - C67.8      PLAN:           Document Letter(s):  Created for Patient: Clinical Summary         Notes:   Unfortunately the  patient has a recurrence of her bladder cancer. Our plan is to proceed to the operating room for re-resection of her bladder tumors. This may be a staged procedure based on the amount of tumor that she had. Hopefully following the operation she can be started on BCG.

## 2020-01-02 NOTE — Interval H&P Note (Signed)
History and Physical Interval Note:  01/02/2020 7:23 AM  Joan Ramirez  has presented today for surgery, with the diagnosis of BLADDER CANCER.  The various methods of treatment have been discussed with the patient and family. After consideration of risks, benefits and other options for treatment, the patient has consented to  Procedure(s): TRANSURETHRAL RESECTION OF BLADDER TUMOR (TURBT) (N/A) CYSTOSCOPY WITH RETROGRADE PYELOGRAM (Right) as a surgical intervention.  The patient's history has been reviewed, patient examined, no change in status, stable for surgery.  I have reviewed the patient's chart and labs.  Questions were answered to the patient's satisfaction.     Ardis Hughs

## 2020-01-02 NOTE — Anesthesia Postprocedure Evaluation (Signed)
Anesthesia Post Note  Patient: Samiksha Pellicano  Procedure(s) Performed: TRANSURETHRAL RESECTION OF BLADDER TUMOR (TURBT) (N/A Bladder) CYSTOSCOPY WITH RETROGRADE PYELOGRAM (Right Ureter)     Patient location during evaluation: PACU Anesthesia Type: General Level of consciousness: awake and alert Pain management: pain level controlled Vital Signs Assessment: post-procedure vital signs reviewed and stable Respiratory status: spontaneous breathing, nonlabored ventilation, respiratory function stable and patient connected to nasal cannula oxygen Cardiovascular status: blood pressure returned to baseline and stable Postop Assessment: no apparent nausea or vomiting Anesthetic complications: no   No complications documented.  Last Vitals:  Vitals:   01/02/20 1018 01/02/20 1030  BP: 139/72 (!) 151/59  Pulse: 68 66  Resp: 18 18  Temp: 36.7 C   SpO2: 97% 97%    Last Pain:  Vitals:   01/02/20 1030  TempSrc:   PainSc: 0-No pain                 Justyn Boyson L Medina Degraffenreid

## 2020-01-02 NOTE — Anesthesia Procedure Notes (Signed)
Procedure Name: Intubation Date/Time: 01/02/2020 7:41 AM Performed by: Gerald Leitz, CRNA Pre-anesthesia Checklist: Patient identified, Patient being monitored, Timeout performed, Emergency Drugs available and Suction available Patient Re-evaluated:Patient Re-evaluated prior to induction Oxygen Delivery Method: Circle system utilized Preoxygenation: Pre-oxygenation with 100% oxygen Induction Type: IV induction Ventilation: Mask ventilation without difficulty Laryngoscope Size: Mac and 3 Grade View: Grade I Tube type: Oral Tube size: 7.0 mm Number of attempts: 1 Placement Confirmation: ETT inserted through vocal cords under direct vision,  positive ETCO2 and breath sounds checked- equal and bilateral Secured at: 21 cm Tube secured with: Tape Dental Injury: Teeth and Oropharynx as per pre-operative assessment

## 2020-01-02 NOTE — Anesthesia Preprocedure Evaluation (Addendum)
Anesthesia Evaluation  Patient identified by MRN, date of birth, ID band Patient awake    Reviewed: Allergy & Precautions, NPO status , Patient's Chart, lab work & pertinent test results  History of Anesthesia Complications (+) PONV  Airway Mallampati: II  TM Distance: >3 FB Neck ROM: Full    Dental  (+) Teeth Intact, Dental Advisory Given   Pulmonary neg pulmonary ROS, former smoker,    Pulmonary exam normal breath sounds clear to auscultation       Cardiovascular hypertension, Pt. on medications Normal cardiovascular exam Rhythm:Regular Rate:Normal     Neuro/Psych negative neurological ROS  negative psych ROS   GI/Hepatic Neg liver ROS, GERD  ,  Endo/Other  diabetes, Oral Hypoglycemic AgentsObese BMI 37  Renal/GU Renal disease (h/o renal pelvis CA )  negative genitourinary   Musculoskeletal negative musculoskeletal ROS (+)   Abdominal   Peds  Hematology negative hematology ROS (+)   Anesthesia Other Findings   Reproductive/Obstetrics                            Anesthesia Physical Anesthesia Plan  ASA: II  Anesthesia Plan: General   Post-op Pain Management:    Induction: Intravenous  PONV Risk Score and Plan: 4 or greater and Midazolam, Dexamethasone, Ondansetron, Scopolamine patch - Pre-op and Propofol infusion  Airway Management Planned: Oral ETT and LMA  Additional Equipment:   Intra-op Plan:   Post-operative Plan: Extubation in OR  Informed Consent: I have reviewed the patients History and Physical, chart, labs and discussed the procedure including the risks, benefits and alternatives for the proposed anesthesia with the patient or authorized representative who has indicated his/her understanding and acceptance.     Dental advisory given  Plan Discussed with: CRNA  Anesthesia Plan Comments:        Anesthesia Quick Evaluation

## 2020-01-02 NOTE — Transfer of Care (Signed)
Immediate Anesthesia Transfer of Care Note  Patient: Joan Ramirez  Procedure(s) Performed: Procedure(s): TRANSURETHRAL RESECTION OF BLADDER TUMOR (TURBT) (N/A) CYSTOSCOPY WITH RETROGRADE PYELOGRAM (Right)  Patient Location: PACU  Anesthesia Type:General  Level of Consciousness: Alert, Awake, Oriented  Airway & Oxygen Therapy: Patient Spontanous Breathing  Post-op Assessment: Report given to RN  Post vital signs: Reviewed and stable  Last Vitals:  Vitals:   01/02/20 0606  BP: (!) 146/72  Pulse: 79  Resp: 16  Temp: 36.9 C  SpO2: 84%    Complications: No apparent anesthesia complications

## 2020-01-02 NOTE — Discharge Instructions (Signed)

## 2020-01-02 NOTE — Op Note (Signed)
Preoperative diagnosis:  1. Recurrent bladder cancer, overlapping sites  Postoperative diagnosis:  1. Same  Procedure: 1. Cystoscopy, right retrograde pyelogram with interpretation 2. Transurethral resection of bladder cancer, 5 cm or greater  Surgeon: Ardis Hughs, MD  Anesthesia: General  Complications: None  Intraoperative findings:  #1: The patient's right retrograde pyelogram demonstrated normal caliber ureter with no filling defects or abnormalities.  The calyces were sharp, there was delayed filling of the lower pole calyx, but ultimately I was able to see it without any appreciable abnormalities. #2: The patient had multifocal tumors involving all parts of the bladder, greater than 10 tumors total.  The largest one was at the trigone and measured approximately 5 cm.  EBL: Minimal  Specimens: None  Indication: Joan Ramirez is a 59 y.o. 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  She subsequently developed recurrence in her bladder.  She was initiated on BCG, but was unable to follow-up because she broke her hip.  Ultimately, she was able to return and cystoscopy demonstrated recurrence of her tumors..  After reviewing the management options for treatment, he elected to proceed with the above surgical procedure(s). We have discussed the potential benefits and risks of the procedure, side effects of the proposed treatment, the likelihood of the patient achieving the goals of the procedure, and any potential problems that might occur during the procedure or recuperation. Informed consent has been obtained.  Description of procedure:  The patient was taken to the operating room and general anesthesia was induced.  The patient was placed in the dorsal lithotomy position, prepped and draped in the usual sterile fashion, and preoperative antibiotics were administered. A preoperative time-out was performed.   21  French 30 degree cystoscope was gently passed to the patient's urethra and into the bladder under visual guidance.  Cystoscopy demonstrated a large tumor burden, particularly at the trigonal region.  However, there was also tumors on the lateral walls bilaterally as well as at the dome.  The tumors were not involving the right ureteral orifice.  Using a 5 [place] open-ended ureteral catheter I injected 10 cc of Omnipaque contrast into the patient's right ureter with the above findings.  I advanced the catheter further up to the renal pelvis and we repeated the study to ensure that all calyces filled and there were no additional filling defects.  I subsequently exchanged the 21 French sheath for the 26 French resectoscope sheath using the visual obturator.  I then exchanged this for the loop cautery element and performed a TURBT in the routine fashion.  All tumor fragments were copiously irrigated.  Reinspection of the area revealed no additional tumors.  Hemostasis was excellent.  I subsequently irrigated the patient's bladder out with 1 L of sterile water.  I then reinspected the patient's bladder to ensure there is no additional bleeding or any tumors that have been missed.  Everything looks great.  As such the bladder was emptied, BNO suppositories inserted the patient's rectum, and she was subsequently extubated and returned to the PACU in good condition.  Ardis Hughs, M.D.

## 2020-01-03 ENCOUNTER — Encounter (HOSPITAL_COMMUNITY): Payer: Self-pay | Admitting: Urology

## 2020-01-03 LAB — SURGICAL PATHOLOGY

## 2020-08-19 ENCOUNTER — Other Ambulatory Visit: Payer: Self-pay | Admitting: Urology

## 2023-12-08 ENCOUNTER — Encounter: Payer: Self-pay | Admitting: Emergency Medicine

## 2023-12-08 ENCOUNTER — Ambulatory Visit: Admission: EM | Admit: 2023-12-08 | Discharge: 2023-12-08 | Disposition: A | Discharge status: Home / Self Care

## 2023-12-08 DIAGNOSIS — M7989 Other specified soft tissue disorders: Secondary | ICD-10-CM | POA: Diagnosis not present

## 2023-12-08 NOTE — Discharge Instructions (Addendum)
 Advised patient if symptoms worsen please go to nearest ED to rule out DVT of right lower leg.  Encouraged increase daily water  intake to 32-64 ounces per day 7 days/week.  Advised if symptoms worsen and or unresolved please follow-up with your PCP, local ED or here for further evaluation.

## 2023-12-08 NOTE — ED Provider Notes (Signed)
 [facility]    CSN: [id number] Arrival date & time: 12/08/23  1738      History   Chief Complaint Chief Complaint  Patient presents with   Leg Swelling    HPI Joan Ramirez is a 63 y.o. female.   HPI 63 year old female presents with right leg swelling for 2 days.  PMH significant for morbid obesity, cancer of urinary tract, CKD (III) and hypertension.  Past Medical History:  Diagnosis Date   Bronchitis    Cancer (HCC)    upper urinary tract transitional cell carcinoma   Chronic kidney disease    kidney CA   Complication of anesthesia    Family history of adverse reaction to anesthesia    BROTHER PONV   GERD (gastroesophageal reflux disease)    occ   History of concussion    2015  no LOC  and no residual   History of kidney stones    Hypertension    Pneumonia AS CHILD, AND 1990'S   PONV (postoperative nausea and vomiting)    Psoriasis    Renal mass, left    PELVIC AREA   Type 2 diabetes mellitus (HCC)     Patient Active Problem List   Diagnosis Date Noted   Bladder cancer (HCC) 06/28/2018   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    Past Surgical History:  Procedure Laterality Date   CYSTOSCOPY N/A 01/01/2016   Procedure: CYSTOSCOPY FLEXIBLE;  Surgeon: Andrez Banker, MD;  Location: [facility];  Service: Urology;  Laterality: N/A;   CYSTOSCOPY W/ RETROGRADES Right 02/06/2019   Procedure: CYSTOSCOPY WITH RETROGRADE PYELOGRAM;  Surgeon: Andrez Banker, MD;  Location: [facility];  Service: Urology;  Laterality: Right;   CYSTOSCOPY W/ RETROGRADES Right 01/02/2020   Procedure: CYSTOSCOPY WITH RETROGRADE PYELOGRAM;  Surgeon: Andrez Banker, MD;  Location: [facility];  Service: Urology;  Laterality: Right;   CYSTOSCOPY W/ URETERAL STENT PLACEMENT Left 10/30/2015   Procedure: CYSTOSCOPY WITH LEFT  RETROGRADE, PYELOGGRAM URETEROSCOPY, BIOPSY AND STENT PLACEMENT;  Surgeon: Mark Ottelin, MD;   Location: [facility];  Service: Urology;  Laterality: Left;   CYSTOSCOPY WITH RETROGRADE PYELOGRAM, URETEROSCOPY AND STENT PLACEMENT Right 07/12/2018   Procedure: RIGHT  RETROGRADE PYELOGRAM, URETEROSCOPY AND STENT PLACEMENT;  Surgeon: Andrez Banker, MD;  Location: [facility];  Service: Urology;  Laterality: Right;   FRACTURE SURGERY Right 03/14/2019   leg   KIDNEY SURGERY  2016   ROBOT ASSITED LAPAROSCOPIC NEPHROURETERECTOMY Left 01/01/2016   Procedure: XI ROBOT ASSITED LAPAROSCOPIC LEFT NEPHROURETERECTOMY;  Surgeon: Andrez Banker, MD;  Location: [facility];  Service: Urology;  Laterality: Left;   TRANSURETHRAL RESECTION OF BLADDER TUMOR N/A 06/28/2018   Procedure: TRANSURETHRAL RESECTION OF BLADDER TUMOR (TURBT);  Surgeon: Andrez Banker, MD;  Location: [facility];  Service: Urology;  Laterality: N/A;   TRANSURETHRAL RESECTION OF BLADDER TUMOR N/A 07/12/2018   Procedure: TRANSURETHRAL RESECTION OF BLADDER TUMOR (TURBT);  Surgeon: Andrez Banker, MD;  Location: [facility];  Service: Urology;  Laterality: N/A;   TRANSURETHRAL RESECTION OF BLADDER TUMOR N/A 02/06/2019   Procedure: TRANSURETHRAL RESECTION OF BLADDER TUMOR (TURBT);  Surgeon: Andrez Banker, MD;  Location: [facility];  Service: Urology;  Laterality: N/A;   TRANSURETHRAL RESECTION OF BLADDER TUMOR N/A 01/02/2020   Procedure: TRANSURETHRAL RESECTION OF BLADDER TUMOR (TURBT);  Surgeon: Andrez Banker, MD;  Location: [facility];  Service: Urology;  Laterality: N/A;  OB History   No obstetric history on file.      Home Medications    Prior to Admission medications   Medication Sig Start Date End Date Taking? Authorizing Provider  cholecalciferol (VITAMIN D3) 25 MCG (1000 UNIT) tablet Take 2,000 Units by mouth.   Yes [provider]  glimepiride  (AMARYL ) 4 MG tablet Take 4 mg by mouth daily with breakfast.    Yes [provider]  losartan  (COZAAR ) 25 MG tablet Take 25 mg by  mouth daily.    Yes [provider]  metFORMIN  (GLUCOPHAGE ) 500 MG tablet Take 500 mg by mouth 2 (two) times daily.    Yes [provider]  phenazopyridine  (PYRIDIUM ) 200 MG tablet Take 1 tablet (200 mg total) by mouth 3 (three) times daily as needed for pain. 01/02/20   Andrez Banker, MD  traMADol  (ULTRAM ) 50 MG tablet Take 1-2 tablets (50-100 mg total) by mouth every 6 (six) hours as needed for moderate pain. 01/02/20   Andrez Banker, MD    Family History History reviewed. No pertinent family history.  Social History Social History   Tobacco Use   Smoking status: Former    Current packs/day: 0.00    Average packs/day: 2.0 packs/day for 20.0 years (40.0 ttl pk-yrs)    Types: Cigarettes    Start date: 07/06/1998    Quit date: 07/06/2018    Years since quitting: 5.4   Smokeless tobacco: Never  Vaping Use   Vaping status: Never Used  Substance Use Topics   Alcohol use: Yes    Comment: rare   Drug use: No     Allergies   Patient has no known allergies.   Review of Systems Review of Systems  Musculoskeletal:        Right lower leg swelling x 2 days     Physical Exam Triage Vital Signs ED Triage Vitals  Encounter Vitals Group     BP      Girls Systolic BP Percentile      Girls Diastolic BP Percentile      Boys Systolic BP Percentile      Boys Diastolic BP Percentile      Pulse      Resp      Temp      Temp src      SpO2      Weight      Height      Head Circumference      Peak Flow      Pain Score      Pain Loc      Pain Education      Exclude from Growth Chart    No data found.  Updated Vital Signs BP 136/74 (BP Location: Right Arm)   Pulse 81   Temp 98.2 F (36.8 C) (Oral)   Resp 18   Ht 5' 5 (1.651 m)   Wt 222 lb (100.7 kg)   SpO2 96%   BMI 36.94 kg/m    Physical Exam Vitals and nursing note reviewed.  Constitutional:      Appearance: Normal appearance. She is obese.  HENT:     Head: Normocephalic and  atraumatic.     Mouth/Throat:     Mouth: Mucous membranes are moist.     Pharynx: Oropharynx is clear.   Eyes:     Extraocular Movements: Extraocular movements intact.     Conjunctiva/sclera: Conjunctivae normal.     Pupils: Pupils are equal, round, and reactive to light.  Cardiovascular:     Pulses: Normal pulses.     Heart sounds: Normal heart sounds.  Pulmonary:     Effort: Pulmonary effort is normal.     Breath sounds: Normal breath sounds. No wheezing, rhonchi or rales.   Musculoskeletal:        General: Normal range of motion.     Comments: Right lower leg with mild edema; PT/DP pulses are +2 and bounding please see image below   Skin:    General: Skin is warm and dry.   Neurological:     General: No focal deficit present.     Mental Status: She is alert and oriented to person, place, and time. Mental status is at baseline.   Psychiatric:        Mood and Affect: Mood normal.        Behavior: Behavior normal.      UC Treatments / Results  Labs (all labs ordered are listed, but only abnormal results are displayed) Labs Reviewed - No data to display  EKG   Radiology No results found.  Procedures Procedures (including critical care time)  Medications Ordered in UC Medications - No data to display  Initial Impression / Assessment and Plan / UC Course  I have reviewed the triage vital signs and the nursing notes.  Pertinent labs & imaging results that were available during my care of the patient were reviewed by me and considered in my medical decision making (see chart for details).     MDM: 1.  Right leg swelling-Advised patient if symptoms worsen please go to nearest ED to rule out DVT of right lower leg.  Encouraged increase daily water  intake 32-64 ounces per day 7 days/week.  Advised if symptoms worsen and or unresolved please follow-up with your PCP, local ED or here for further evaluation.  Patient discharged home, hemodynamically stable. Final  Clinical Impressions(s) / UC Diagnoses   Final diagnoses:  Right leg swelling     Discharge Instructions      Advised patient if symptoms worsen please go to nearest ED to rule out DVT of right lower leg.  Encouraged increase daily water  intake to 32-64 ounces per day 7 days/week.  Advised if symptoms worsen and or unresolved please follow-up with your PCP, local ED or here for further evaluation.     ED Prescriptions   None    PDMP not reviewed this encounter.   Leonides Ramp, FNP 12/08/23 1831

## 2023-12-08 NOTE — ED Triage Notes (Signed)
 Patient states that her right lower leg has been swelling x 2 days.  Patient does sit a lot for work, has increased her water  intake.  Sleeping with legs elevated.  No apparent injury.
